# Patient Record
Sex: Female | Born: 1945 | Race: White | Hispanic: No | Smoking: Never smoker
Health system: Southern US, Community
[De-identification: ages and names within clinical notes are randomized; demographics above are authoritative.]

## PROBLEM LIST (undated history)

## (undated) DIAGNOSIS — M1990 Unspecified osteoarthritis, unspecified site: Secondary | ICD-10-CM

## (undated) DIAGNOSIS — R011 Cardiac murmur, unspecified: Secondary | ICD-10-CM

## (undated) DIAGNOSIS — E119 Type 2 diabetes mellitus without complications: Secondary | ICD-10-CM

## (undated) DIAGNOSIS — E7800 Pure hypercholesterolemia, unspecified: Secondary | ICD-10-CM

## (undated) DIAGNOSIS — G629 Polyneuropathy, unspecified: Secondary | ICD-10-CM

## (undated) DIAGNOSIS — I10 Essential (primary) hypertension: Secondary | ICD-10-CM

## (undated) DIAGNOSIS — E039 Hypothyroidism, unspecified: Secondary | ICD-10-CM

## (undated) HISTORY — PX: ABDOMINAL HYSTERECTOMY: SHX81

## (undated) HISTORY — PX: BACK SURGERY: SHX140

## (undated) HISTORY — PX: CHOLECYSTECTOMY: SHX55

## (undated) HISTORY — PX: EYE SURGERY: SHX253

---

## 2006-01-08 ENCOUNTER — Ambulatory Visit: Payer: Self-pay | Admitting: Unknown Physician Specialty

## 2006-01-19 ENCOUNTER — Ambulatory Visit: Payer: Self-pay | Admitting: Unknown Physician Specialty

## 2006-02-11 ENCOUNTER — Ambulatory Visit: Payer: Self-pay | Admitting: Pain Medicine

## 2006-02-15 ENCOUNTER — Emergency Department: Payer: Self-pay | Admitting: Emergency Medicine

## 2006-02-24 ENCOUNTER — Ambulatory Visit: Payer: Self-pay | Admitting: Pain Medicine

## 2006-03-17 ENCOUNTER — Ambulatory Visit: Payer: Self-pay | Admitting: Physician Assistant

## 2006-04-01 ENCOUNTER — Ambulatory Visit: Payer: Self-pay | Admitting: Pain Medicine

## 2006-04-02 ENCOUNTER — Ambulatory Visit: Payer: Self-pay | Admitting: Pain Medicine

## 2006-04-23 ENCOUNTER — Ambulatory Visit: Payer: Self-pay | Admitting: Pain Medicine

## 2006-05-18 ENCOUNTER — Ambulatory Visit: Payer: Self-pay | Admitting: Physician Assistant

## 2011-08-28 ENCOUNTER — Emergency Department: Payer: Self-pay | Admitting: Emergency Medicine

## 2011-08-28 LAB — URINALYSIS, COMPLETE
Glucose,UR: NEGATIVE mg/dL (ref 0–75)
Nitrite: NEGATIVE
Protein: NEGATIVE
RBC,UR: 4 /HPF (ref 0–5)
Squamous Epithelial: 5
WBC UR: 6 /HPF (ref 0–5)

## 2012-08-09 DIAGNOSIS — G894 Chronic pain syndrome: Secondary | ICD-10-CM | POA: Insufficient documentation

## 2012-08-09 DIAGNOSIS — M47817 Spondylosis without myelopathy or radiculopathy, lumbosacral region: Secondary | ICD-10-CM | POA: Insufficient documentation

## 2012-09-08 ENCOUNTER — Ambulatory Visit: Payer: Self-pay | Admitting: Family Medicine

## 2012-11-19 ENCOUNTER — Ambulatory Visit: Payer: Self-pay | Admitting: Unknown Physician Specialty

## 2013-09-02 ENCOUNTER — Encounter: Payer: Self-pay | Admitting: Internal Medicine

## 2013-09-06 LAB — HEMOGLOBIN A1C: HEMOGLOBIN A1C: 5.5 % (ref 4.2–6.3)

## 2013-09-08 LAB — CBC WITH DIFFERENTIAL/PLATELET
BASOS PCT: 0.6 %
Basophil #: 0.1 10*3/uL (ref 0.0–0.1)
EOS ABS: 0.2 10*3/uL (ref 0.0–0.7)
Eosinophil %: 2.1 %
HCT: 27.2 % — AB (ref 35.0–47.0)
HGB: 8.9 g/dL — ABNORMAL LOW (ref 12.0–16.0)
LYMPHS PCT: 28.8 %
Lymphocyte #: 3.2 10*3/uL (ref 1.0–3.6)
MCH: 29 pg (ref 26.0–34.0)
MCHC: 32.6 g/dL (ref 32.0–36.0)
MCV: 89 fL (ref 80–100)
MONOS PCT: 8.7 %
Monocyte #: 1 x10 3/mm — ABNORMAL HIGH (ref 0.2–0.9)
NEUTROS ABS: 6.6 10*3/uL — AB (ref 1.4–6.5)
Neutrophil %: 59.8 %
PLATELETS: 446 10*3/uL — AB (ref 150–440)
RBC: 3.06 10*6/uL — AB (ref 3.80–5.20)
RDW: 14.2 % (ref 11.5–14.5)
WBC: 11.1 10*3/uL — AB (ref 3.6–11.0)

## 2013-09-19 ENCOUNTER — Encounter: Payer: Self-pay | Admitting: Internal Medicine

## 2013-09-20 LAB — URINALYSIS, COMPLETE
BLOOD: NEGATIVE
Bilirubin,UR: NEGATIVE
Glucose,UR: NEGATIVE mg/dL (ref 0–75)
Ketone: NEGATIVE
NITRITE: NEGATIVE
Ph: 7 (ref 4.5–8.0)
SPECIFIC GRAVITY: 1.018 (ref 1.003–1.030)
Squamous Epithelial: 2
WBC UR: 1000 /HPF (ref 0–5)

## 2013-09-22 LAB — URINE CULTURE

## 2013-09-28 LAB — URINALYSIS, COMPLETE
Bilirubin,UR: NEGATIVE
GLUCOSE, UR: NEGATIVE mg/dL (ref 0–75)
Ketone: NEGATIVE
Nitrite: POSITIVE
PH: 5 (ref 4.5–8.0)
RBC,UR: 69 /HPF (ref 0–5)
SPECIFIC GRAVITY: 1.017 (ref 1.003–1.030)
WBC UR: 5244 /HPF (ref 0–5)

## 2013-10-01 LAB — URINE CULTURE

## 2014-04-25 DIAGNOSIS — E039 Hypothyroidism, unspecified: Secondary | ICD-10-CM | POA: Diagnosis not present

## 2014-04-25 DIAGNOSIS — E785 Hyperlipidemia, unspecified: Secondary | ICD-10-CM | POA: Diagnosis not present

## 2014-04-25 DIAGNOSIS — E119 Type 2 diabetes mellitus without complications: Secondary | ICD-10-CM | POA: Diagnosis not present

## 2014-04-25 DIAGNOSIS — I10 Essential (primary) hypertension: Secondary | ICD-10-CM | POA: Diagnosis not present

## 2014-05-24 DIAGNOSIS — M545 Low back pain: Secondary | ICD-10-CM | POA: Diagnosis not present

## 2014-05-24 DIAGNOSIS — G894 Chronic pain syndrome: Secondary | ICD-10-CM | POA: Diagnosis not present

## 2014-05-24 DIAGNOSIS — M533 Sacrococcygeal disorders, not elsewhere classified: Secondary | ICD-10-CM | POA: Diagnosis not present

## 2014-05-24 DIAGNOSIS — Z79891 Long term (current) use of opiate analgesic: Secondary | ICD-10-CM | POA: Diagnosis not present

## 2014-05-24 DIAGNOSIS — M419 Scoliosis, unspecified: Secondary | ICD-10-CM | POA: Diagnosis not present

## 2014-05-24 DIAGNOSIS — K5909 Other constipation: Secondary | ICD-10-CM | POA: Diagnosis not present

## 2014-05-30 DIAGNOSIS — M533 Sacrococcygeal disorders, not elsewhere classified: Secondary | ICD-10-CM | POA: Diagnosis not present

## 2014-05-30 DIAGNOSIS — Z79899 Other long term (current) drug therapy: Secondary | ICD-10-CM | POA: Diagnosis not present

## 2014-08-11 DIAGNOSIS — G894 Chronic pain syndrome: Secondary | ICD-10-CM | POA: Diagnosis not present

## 2014-08-11 DIAGNOSIS — M545 Low back pain: Secondary | ICD-10-CM | POA: Diagnosis not present

## 2014-08-11 DIAGNOSIS — Z79899 Other long term (current) drug therapy: Secondary | ICD-10-CM | POA: Diagnosis not present

## 2014-08-11 DIAGNOSIS — M533 Sacrococcygeal disorders, not elsewhere classified: Secondary | ICD-10-CM | POA: Diagnosis not present

## 2014-08-16 DIAGNOSIS — E782 Mixed hyperlipidemia: Secondary | ICD-10-CM | POA: Diagnosis not present

## 2014-08-16 DIAGNOSIS — E119 Type 2 diabetes mellitus without complications: Secondary | ICD-10-CM | POA: Diagnosis not present

## 2014-08-16 DIAGNOSIS — I10 Essential (primary) hypertension: Secondary | ICD-10-CM | POA: Diagnosis not present

## 2014-08-23 DIAGNOSIS — Z0000 Encounter for general adult medical examination without abnormal findings: Secondary | ICD-10-CM | POA: Diagnosis not present

## 2014-08-23 DIAGNOSIS — E039 Hypothyroidism, unspecified: Secondary | ICD-10-CM | POA: Diagnosis not present

## 2014-08-23 DIAGNOSIS — I10 Essential (primary) hypertension: Secondary | ICD-10-CM | POA: Diagnosis not present

## 2014-08-23 DIAGNOSIS — E782 Mixed hyperlipidemia: Secondary | ICD-10-CM | POA: Diagnosis not present

## 2014-09-12 ENCOUNTER — Other Ambulatory Visit: Payer: Self-pay | Admitting: Family Medicine

## 2014-09-12 DIAGNOSIS — Z1231 Encounter for screening mammogram for malignant neoplasm of breast: Secondary | ICD-10-CM

## 2014-09-25 ENCOUNTER — Other Ambulatory Visit: Payer: Self-pay | Admitting: Family Medicine

## 2014-09-25 ENCOUNTER — Ambulatory Visit
Admission: RE | Admit: 2014-09-25 | Discharge: 2014-09-25 | Disposition: A | Discharge status: Home / Self Care | Payer: Commercial Managed Care - HMO | Source: Ambulatory Visit | Attending: Family Medicine | Admitting: Family Medicine

## 2014-09-25 DIAGNOSIS — Z1231 Encounter for screening mammogram for malignant neoplasm of breast: Secondary | ICD-10-CM

## 2014-11-03 DIAGNOSIS — G894 Chronic pain syndrome: Secondary | ICD-10-CM | POA: Diagnosis not present

## 2014-11-03 DIAGNOSIS — M533 Sacrococcygeal disorders, not elsewhere classified: Secondary | ICD-10-CM | POA: Diagnosis not present

## 2014-11-03 DIAGNOSIS — Z79899 Other long term (current) drug therapy: Secondary | ICD-10-CM | POA: Diagnosis not present

## 2014-11-03 DIAGNOSIS — M545 Low back pain: Secondary | ICD-10-CM | POA: Diagnosis not present

## 2014-11-03 DIAGNOSIS — M47817 Spondylosis without myelopathy or radiculopathy, lumbosacral region: Secondary | ICD-10-CM | POA: Diagnosis not present

## 2014-11-03 DIAGNOSIS — M4804 Spinal stenosis, thoracic region: Secondary | ICD-10-CM | POA: Diagnosis not present

## 2014-12-19 DIAGNOSIS — E119 Type 2 diabetes mellitus without complications: Secondary | ICD-10-CM | POA: Diagnosis not present

## 2014-12-19 DIAGNOSIS — I10 Essential (primary) hypertension: Secondary | ICD-10-CM | POA: Diagnosis not present

## 2014-12-19 DIAGNOSIS — E782 Mixed hyperlipidemia: Secondary | ICD-10-CM | POA: Diagnosis not present

## 2014-12-19 DIAGNOSIS — E039 Hypothyroidism, unspecified: Secondary | ICD-10-CM | POA: Diagnosis not present

## 2014-12-26 DIAGNOSIS — E119 Type 2 diabetes mellitus without complications: Secondary | ICD-10-CM | POA: Diagnosis not present

## 2014-12-26 DIAGNOSIS — I10 Essential (primary) hypertension: Secondary | ICD-10-CM | POA: Diagnosis not present

## 2014-12-26 DIAGNOSIS — E039 Hypothyroidism, unspecified: Secondary | ICD-10-CM | POA: Diagnosis not present

## 2014-12-26 DIAGNOSIS — E782 Mixed hyperlipidemia: Secondary | ICD-10-CM | POA: Diagnosis not present

## 2015-02-19 DIAGNOSIS — M961 Postlaminectomy syndrome, not elsewhere classified: Secondary | ICD-10-CM | POA: Diagnosis not present

## 2015-02-19 DIAGNOSIS — G894 Chronic pain syndrome: Secondary | ICD-10-CM | POA: Diagnosis not present

## 2015-02-19 DIAGNOSIS — Z79891 Long term (current) use of opiate analgesic: Secondary | ICD-10-CM | POA: Diagnosis not present

## 2015-02-19 DIAGNOSIS — M47817 Spondylosis without myelopathy or radiculopathy, lumbosacral region: Secondary | ICD-10-CM | POA: Diagnosis not present

## 2015-02-19 DIAGNOSIS — M533 Sacrococcygeal disorders, not elsewhere classified: Secondary | ICD-10-CM | POA: Diagnosis not present

## 2015-02-20 DIAGNOSIS — E039 Hypothyroidism, unspecified: Secondary | ICD-10-CM | POA: Diagnosis not present

## 2015-03-05 DIAGNOSIS — R350 Frequency of micturition: Secondary | ICD-10-CM | POA: Diagnosis not present

## 2015-04-18 DIAGNOSIS — M961 Postlaminectomy syndrome, not elsewhere classified: Secondary | ICD-10-CM | POA: Diagnosis not present

## 2015-04-18 DIAGNOSIS — M4804 Spinal stenosis, thoracic region: Secondary | ICD-10-CM | POA: Diagnosis not present

## 2015-04-18 DIAGNOSIS — G894 Chronic pain syndrome: Secondary | ICD-10-CM | POA: Diagnosis not present

## 2015-04-18 DIAGNOSIS — Z79899 Other long term (current) drug therapy: Secondary | ICD-10-CM | POA: Diagnosis not present

## 2015-04-18 DIAGNOSIS — Z981 Arthrodesis status: Secondary | ICD-10-CM | POA: Diagnosis not present

## 2015-04-18 DIAGNOSIS — M545 Low back pain: Secondary | ICD-10-CM | POA: Diagnosis not present

## 2015-04-18 DIAGNOSIS — M533 Sacrococcygeal disorders, not elsewhere classified: Secondary | ICD-10-CM | POA: Diagnosis not present

## 2015-04-18 DIAGNOSIS — G8929 Other chronic pain: Secondary | ICD-10-CM | POA: Diagnosis not present

## 2015-04-18 DIAGNOSIS — Z79891 Long term (current) use of opiate analgesic: Secondary | ICD-10-CM | POA: Diagnosis not present

## 2015-04-18 DIAGNOSIS — Z7982 Long term (current) use of aspirin: Secondary | ICD-10-CM | POA: Diagnosis not present

## 2015-04-18 DIAGNOSIS — M47817 Spondylosis without myelopathy or radiculopathy, lumbosacral region: Secondary | ICD-10-CM | POA: Diagnosis not present

## 2015-04-20 DIAGNOSIS — E119 Type 2 diabetes mellitus without complications: Secondary | ICD-10-CM | POA: Diagnosis not present

## 2015-04-20 DIAGNOSIS — E782 Mixed hyperlipidemia: Secondary | ICD-10-CM | POA: Diagnosis not present

## 2015-04-20 DIAGNOSIS — I10 Essential (primary) hypertension: Secondary | ICD-10-CM | POA: Diagnosis not present

## 2015-04-27 DIAGNOSIS — I10 Essential (primary) hypertension: Secondary | ICD-10-CM | POA: Diagnosis not present

## 2015-04-27 DIAGNOSIS — E119 Type 2 diabetes mellitus without complications: Secondary | ICD-10-CM | POA: Diagnosis not present

## 2015-04-27 DIAGNOSIS — N289 Disorder of kidney and ureter, unspecified: Secondary | ICD-10-CM | POA: Diagnosis not present

## 2015-04-27 DIAGNOSIS — E782 Mixed hyperlipidemia: Secondary | ICD-10-CM | POA: Diagnosis not present

## 2015-04-27 DIAGNOSIS — E039 Hypothyroidism, unspecified: Secondary | ICD-10-CM | POA: Diagnosis not present

## 2015-05-25 DIAGNOSIS — H2511 Age-related nuclear cataract, right eye: Secondary | ICD-10-CM | POA: Diagnosis not present

## 2015-06-22 DIAGNOSIS — H2511 Age-related nuclear cataract, right eye: Secondary | ICD-10-CM | POA: Diagnosis not present

## 2015-06-28 ENCOUNTER — Encounter: Payer: Self-pay | Admitting: *Deleted

## 2015-07-03 ENCOUNTER — Ambulatory Visit
Admission: RE | Admit: 2015-07-03 | Discharge: 2015-07-03 | Disposition: A | Discharge status: Home / Self Care | Payer: Commercial Managed Care - HMO | Source: Ambulatory Visit | Attending: Ophthalmology | Admitting: Ophthalmology

## 2015-07-03 ENCOUNTER — Ambulatory Visit: Payer: Commercial Managed Care - HMO | Admitting: Anesthesiology

## 2015-07-03 ENCOUNTER — Encounter: Payer: Self-pay | Admitting: *Deleted

## 2015-07-03 ENCOUNTER — Encounter
Admission: RE | Disposition: A | Discharge status: Home / Self Care | Payer: Self-pay | Source: Ambulatory Visit | Attending: Ophthalmology

## 2015-07-03 DIAGNOSIS — H2511 Age-related nuclear cataract, right eye: Secondary | ICD-10-CM | POA: Diagnosis not present

## 2015-07-03 DIAGNOSIS — E119 Type 2 diabetes mellitus without complications: Secondary | ICD-10-CM | POA: Diagnosis not present

## 2015-07-03 DIAGNOSIS — Z9049 Acquired absence of other specified parts of digestive tract: Secondary | ICD-10-CM | POA: Insufficient documentation

## 2015-07-03 DIAGNOSIS — Z880 Allergy status to penicillin: Secondary | ICD-10-CM | POA: Diagnosis not present

## 2015-07-03 DIAGNOSIS — E039 Hypothyroidism, unspecified: Secondary | ICD-10-CM | POA: Diagnosis not present

## 2015-07-03 DIAGNOSIS — E7800 Pure hypercholesterolemia, unspecified: Secondary | ICD-10-CM | POA: Diagnosis not present

## 2015-07-03 DIAGNOSIS — Z90710 Acquired absence of both cervix and uterus: Secondary | ICD-10-CM | POA: Diagnosis not present

## 2015-07-03 DIAGNOSIS — G629 Polyneuropathy, unspecified: Secondary | ICD-10-CM | POA: Insufficient documentation

## 2015-07-03 DIAGNOSIS — I10 Essential (primary) hypertension: Secondary | ICD-10-CM | POA: Diagnosis not present

## 2015-07-03 DIAGNOSIS — E079 Disorder of thyroid, unspecified: Secondary | ICD-10-CM | POA: Insufficient documentation

## 2015-07-03 DIAGNOSIS — G4730 Sleep apnea, unspecified: Secondary | ICD-10-CM | POA: Diagnosis not present

## 2015-07-03 HISTORY — DX: Hypothyroidism, unspecified: E03.9

## 2015-07-03 HISTORY — DX: Polyneuropathy, unspecified: G62.9

## 2015-07-03 HISTORY — DX: Type 2 diabetes mellitus without complications: E11.9

## 2015-07-03 HISTORY — PX: CATARACT EXTRACTION W/PHACO: SHX586

## 2015-07-03 HISTORY — DX: Essential (primary) hypertension: I10

## 2015-07-03 LAB — GLUCOSE, CAPILLARY: Glucose-Capillary: 111 mg/dL — ABNORMAL HIGH (ref 65–99)

## 2015-07-03 SURGERY — PHACOEMULSIFICATION, CATARACT, WITH IOL INSERTION
Anesthesia: Monitor Anesthesia Care | Site: Eye | Laterality: Right | Wound class: Clean

## 2015-07-03 MED ORDER — NA CHONDROIT SULF-NA HYALURON 40-17 MG/ML IO SOLN
INTRAOCULAR | Status: AC
  Filled 2015-07-03: qty 1

## 2015-07-03 MED ORDER — CARBACHOL 0.01 % IO SOLN
INTRAOCULAR | Status: DC | PRN
  Administered 2015-07-03: 0.5 mL via INTRAOCULAR

## 2015-07-03 MED ORDER — TETRACAINE HCL 0.5 % OP SOLN
1.0000 [drp] | OPHTHALMIC | Status: AC | PRN
  Administered 2015-07-03: 1 [drp] via OPHTHALMIC

## 2015-07-03 MED ORDER — MOXIFLOXACIN HCL 0.5 % OP SOLN
OPHTHALMIC | Status: AC
  Filled 2015-07-03: qty 3

## 2015-07-03 MED ORDER — ARMC OPHTHALMIC DILATING GEL
1.0000 | OPHTHALMIC | Status: AC | PRN
Start: 2015-07-03 — End: 2015-07-03
  Administered 2015-07-03 (×2): 1 via OPHTHALMIC

## 2015-07-03 MED ORDER — POVIDONE-IODINE 5 % OP SOLN
1.0000 "application " | OPHTHALMIC | Status: AC | PRN
  Administered 2015-07-03: 1 via OPHTHALMIC

## 2015-07-03 MED ORDER — EPINEPHRINE HCL 1 MG/ML IJ SOLN
INTRAMUSCULAR | Status: AC
  Filled 2015-07-03: qty 1

## 2015-07-03 MED ORDER — TETRACAINE HCL 0.5 % OP SOLN
OPHTHALMIC | Status: AC
Start: 2015-07-03 — End: 2015-07-03
  Administered 2015-07-03: 1 [drp] via OPHTHALMIC
  Filled 2015-07-03: qty 2

## 2015-07-03 MED ORDER — CEFUROXIME OPHTHALMIC INJECTION 1 MG/0.1 ML
INJECTION | OPHTHALMIC | Status: AC
  Filled 2015-07-03: qty 0.1

## 2015-07-03 MED ORDER — MOXIFLOXACIN HCL 0.5 % OP SOLN
OPHTHALMIC | Status: DC | PRN
  Administered 2015-07-03: 1 [drp] via OPHTHALMIC

## 2015-07-03 MED ORDER — CEFUROXIME OPHTHALMIC INJECTION 1 MG/0.1 ML
INJECTION | OPHTHALMIC | Status: DC | PRN
  Administered 2015-07-03: 0.1 mL via INTRACAMERAL

## 2015-07-03 MED ORDER — POVIDONE-IODINE 5 % OP SOLN
OPHTHALMIC | Status: AC
Start: 2015-07-03 — End: 2015-07-03
  Administered 2015-07-03: 1 via OPHTHALMIC
  Filled 2015-07-03: qty 30

## 2015-07-03 MED ORDER — FENTANYL CITRATE (PF) 100 MCG/2ML IJ SOLN
INTRAMUSCULAR | Status: DC | PRN
  Administered 2015-07-03: 50 ug via INTRAVENOUS

## 2015-07-03 MED ORDER — ARMC OPHTHALMIC DILATING GEL
OPHTHALMIC | Status: AC
  Administered 2015-07-03: 1 via OPHTHALMIC
  Filled 2015-07-03: qty 0.25

## 2015-07-03 MED ORDER — SODIUM CHLORIDE 0.9 % IV SOLN
INTRAVENOUS | Status: DC
  Administered 2015-07-03: 09:00:00 via INTRAVENOUS

## 2015-07-03 MED ORDER — MOXIFLOXACIN HCL 0.5 % OP SOLN: 1.0000 [drp] | OPHTHALMIC | Status: DC | PRN

## 2015-07-03 MED ORDER — EPINEPHRINE HCL 1 MG/ML IJ SOLN
INTRAOCULAR | Status: DC | PRN
  Administered 2015-07-03: 1 mL via OPHTHALMIC

## 2015-07-03 MED ORDER — MIDAZOLAM HCL 2 MG/2ML IJ SOLN
INTRAMUSCULAR | Status: DC | PRN
  Administered 2015-07-03: 1 mg via INTRAVENOUS

## 2015-07-03 MED ORDER — NA CHONDROIT SULF-NA HYALURON 40-17 MG/ML IO SOLN
INTRAOCULAR | Status: DC | PRN
  Administered 2015-07-03: 1 mL via INTRAOCULAR

## 2015-07-03 SURGICAL SUPPLY — 22 items
CANNULA ANT/CHMB 27GA (MISCELLANEOUS) ×3 IMPLANT
CUP MEDICINE 2OZ PLAST GRAD ST (MISCELLANEOUS) ×3 IMPLANT
GLOVE BIO SURGEON STRL SZ8 (GLOVE) ×3 IMPLANT
GLOVE BIOGEL M 6.5 STRL (GLOVE) ×3 IMPLANT
GLOVE SURG LX 8.0 MICRO (GLOVE) ×2
GLOVE SURG LX STRL 8.0 MICRO (GLOVE) ×1 IMPLANT
GOWN STRL REUS W/ TWL LRG LVL3 (GOWN DISPOSABLE) ×2 IMPLANT
GOWN STRL REUS W/TWL LRG LVL3 (GOWN DISPOSABLE) ×4
LENS IOL TECNIS 20.5 (Intraocular Lens) ×3 IMPLANT
LENS IOL TECNIS MONO 1P 20.5 (Intraocular Lens) ×1 IMPLANT
PACK CATARACT (MISCELLANEOUS) ×3 IMPLANT
PACK CATARACT BRASINGTON LX (MISCELLANEOUS) ×3 IMPLANT
PACK EYE AFTER SURG (MISCELLANEOUS) ×3 IMPLANT
SOL BSS BAG (MISCELLANEOUS) ×3
SOL PREP PVP 2OZ (MISCELLANEOUS) ×3
SOLUTION BSS BAG (MISCELLANEOUS) ×1 IMPLANT
SOLUTION PREP PVP 2OZ (MISCELLANEOUS) ×1 IMPLANT
SYR 3ML LL SCALE MARK (SYRINGE) ×3 IMPLANT
SYR 5ML LL (SYRINGE) ×3 IMPLANT
SYR TB 1ML 27GX1/2 LL (SYRINGE) ×3 IMPLANT
WATER STERILE IRR 1000ML POUR (IV SOLUTION) ×3 IMPLANT
WIPE NON LINTING 3.25X3.25 (MISCELLANEOUS) ×3 IMPLANT

## 2015-07-03 NOTE — Discharge Instructions (Signed)
AMBULATORY SURGERY  DISCHARGE INSTRUCTIONS   1) The drugs that you were given will stay in your system until tomorrow so for the next 24 hours you should not:  A) Drive an automobile B) Make any legal decisions C) Drink any alcoholic beverage   2) You may resume regular meals tomorrow.  Today it is better to start with liquids and gradually work up to solid foods.  You may eat anything you prefer, but it is better to start with liquids, then soup and crackers, and gradually work up to solid foods.   3) Please notify your doctor immediately if you have any unusual bleeding, trouble breathing, redness and pain at the surgery site, drainage, fever, or pain not relieved by medication.    4) Additional Instructions:   Eye Surgery Discharge Instructions  Expect mild scratchy sensation or mild soreness. DO NOT RUB YOUR EYE!  The day of surgery:  Minimal physical activity, but bed rest is not required  No reading, computer work, or close hand work  No bending, lifting, or straining.  May watch TV  For 24 hours:  No driving, legal decisions, or alcoholic beverages  Safety precautions  Eat anything you prefer: It is better to start with liquids, then soup then solid foods.  _____ Eye patch should be worn until postoperative exam tomorrow.  ____ Solar shield eyeglasses should be worn for comfort in the sunlight/patch while sleeping  Resume all regular medications including aspirin or Coumadin if these were discontinued prior to surgery. You may shower, bathe, shave, or wash your hair. Tylenol may be taken for mild discomfort.  Call your doctor if you experience significant pain, nausea, or vomiting, fever > 101 or other signs of infection. [phone] or [phone] Specific instructions:  Follow-up Information    Follow up with PORFILIO,WILLIAM LOUIS, MD In 1 day.   Specialty:  Ophthalmology   Why:  March 15 at 10:00am   Contact information:   [place] [address] [phone]          Please contact your physician with any problems or Same Day Surgery at [phone], Monday through Friday 6 am to 4 pm, or [place] at [facility] number at [phone].

## 2015-07-03 NOTE — H&P (Signed)
All labs reviewed. Abnormal studies sent to patients PCP when indicated.  Previous H&P reviewed, patient examined, there are NO CHANGES.  Cynthia Dawson LOUIS3/14/20178:42 AM

## 2015-07-03 NOTE — Anesthesia Procedure Notes (Signed)
Procedure Name: MAC Date/Time: 07/03/2015 8:30 AM Performed by: Allean Found Pre-anesthesia Checklist: Patient identified, Emergency Drugs available, Suction available, Patient being monitored and Timeout performed Patient Re-evaluated:Patient Re-evaluated prior to inductionOxygen Delivery Method: Nasal cannula Intubation Type: IV induction Placement Confirmation: positive ETCO2

## 2015-07-03 NOTE — Op Note (Signed)
PREOPERATIVE DIAGNOSIS:  Nuclear sclerotic cataract of the right eye.   POSTOPERATIVE DIAGNOSIS:  nuclear sclerotic cataract right eye   OPERATIVE PROCEDURE: Procedure(s): CATARACT EXTRACTION PHACO AND INTRAOCULAR LENS PLACEMENT (IOC)   SURGEON:  Birder Robson, MD.   ANESTHESIA:  Anesthesiologist: Gijsbertus Lonia Mad, MD CRNA: Allean Found, CRNA  1.      Managed anesthesia care. 2.      Topical tetracaine drops followed by 2% Xylocaine jelly applied in the preoperative holding area.   COMPLICATIONS:  None.   TECHNIQUE:   Stop and chop   DESCRIPTION OF PROCEDURE:  The patient was examined and consented in the preoperative holding area where the aforementioned topical anesthesia was applied to the right eye and then brought back to the Operating Room where the right eye was prepped and draped in the usual sterile ophthalmic fashion and a lid speculum was placed. A paracentesis was created with the side port blade and the anterior chamber was filled with viscoelastic. A near clear corneal incision was performed with the steel keratome. A continuous curvilinear capsulorrhexis was performed with a cystotome followed by the capsulorrhexis forceps. Hydrodissection and hydrodelineation were carried out with BSS on a blunt cannula. The lens was removed in a stop and chop  technique and the remaining cortical material was removed with the irrigation-aspiration handpiece. The capsular bag was inflated with viscoelastic and the Technis [id number]  lens was placed in the capsular bag without complication. The remaining viscoelastic was removed from the eye with the irrigation-aspiration handpiece. The wounds were hydrated. The anterior chamber was flushed with Miostat and the eye was inflated to physiologic pressure. 0.2 mL of Vigamox diluted three/one with BSS was placed in the anterior chamber. The wounds were found to be water tight. The eye was dressed with Vigamox. The patient was given protective  glasses to wear throughout the day and a shield with which to sleep tonight. The patient was also given drops with which to begin a drop regimen today and will follow-up with me in one day.  Implant Name Type Inv. Item Serial No. Manufacturer Lot No. LRB No. Used  LENS IOL TECNIS 20.5 - [id number] Intraocular Lens LENS IOL TECNIS 20.5 [id number]   Right 1   Procedure(s) with comments: CATARACT EXTRACTION PHACO AND INTRAOCULAR LENS PLACEMENT (IOC) (Right) - [place] 01:12 AP% 22.4 CDE 16.28 fluid pack lot # [id number]  Electronically signed: [place] 07/03/2015 9:09 AM

## 2015-07-03 NOTE — Transfer of Care (Signed)
Immediate Anesthesia Transfer of Care Note  Patient: Camaria Nicklow Scearce  Procedure(s) Performed: Procedure(s) with comments: CATARACT EXTRACTION PHACO AND INTRAOCULAR LENS PLACEMENT (IOC) (Right) - [place] 01:12 AP% 22.4 CDE 16.28 fluid pack lot # [id number]  Patient Location: PACU  Anesthesia Type:MAC  Level of Consciousness: awake  Airway & Oxygen Therapy: Patient Spontanous Breathing  Post-op Assessment: Report given to RN and Post -op Vital signs reviewed and stable  Post vital signs: Reviewed and stable  Last Vitals:  Filed Vitals:   07/03/15 0712  BP: 138/63  Pulse: 72  Temp: 37 C  Resp: 16    Complications: No apparent anesthesia complications

## 2015-07-03 NOTE — Anesthesia Preprocedure Evaluation (Signed)
Anesthesia Evaluation  Patient identified by MRN, date of birth, ID band Patient awake    Reviewed: Allergy & Precautions, NPO status , Patient's Chart, lab work & pertinent test results  Airway Mallampati: III       Dental  (+) Teeth Intact   Pulmonary sleep apnea ,     + decreased breath sounds      Cardiovascular Exercise Tolerance: Good hypertension, Pt. on medications and Pt. on home beta blockers  Rhythm:Regular     Neuro/Psych    GI/Hepatic   Endo/Other  diabetes, Well Controlled, Type 2, Oral Hypoglycemic AgentsHypothyroidism Morbid obesity  Renal/GU negative Renal ROS     Musculoskeletal   Abdominal (+) + obese,   Peds  Hematology   Anesthesia Other Findings   Reproductive/Obstetrics                             Anesthesia Physical Anesthesia Plan  ASA: III  Anesthesia Plan: MAC   Post-op Pain Management:    Induction: Intravenous  Airway Management Planned: Natural Airway and Nasal Cannula  Additional Equipment:   Intra-op Plan:   Post-operative Plan:   Informed Consent: I have reviewed the patients History and Physical, chart, labs and discussed the procedure including the risks, benefits and alternatives for the proposed anesthesia with the patient or authorized representative who has indicated his/her understanding and acceptance.     Plan Discussed with: CRNA  Anesthesia Plan Comments:         Anesthesia Quick Evaluation

## 2015-07-03 NOTE — Anesthesia Postprocedure Evaluation (Signed)
Anesthesia Post Note  Patient: Wei Lohmeier Onken  Procedure(s) Performed: Procedure(s) (LRB): CATARACT EXTRACTION PHACO AND INTRAOCULAR LENS PLACEMENT (IOC) (Right)  Patient location during evaluation: PACU Anesthesia Type: MAC Level of consciousness: awake Pain management: pain level controlled Vital Signs Assessment: post-procedure vital signs reviewed and stable Respiratory status: spontaneous breathing Cardiovascular status: blood pressure returned to baseline Postop Assessment: no headache Anesthetic complications: no    Last Vitals:  Filed Vitals:   07/03/15 0712  BP: 138/63  Pulse: 72  Temp: 37 C  Resp: 16    Last Pain: There were no vitals filed for this visit.               Buckner [place]

## 2015-07-13 DIAGNOSIS — M47817 Spondylosis without myelopathy or radiculopathy, lumbosacral region: Secondary | ICD-10-CM | POA: Diagnosis not present

## 2015-07-13 DIAGNOSIS — G894 Chronic pain syndrome: Secondary | ICD-10-CM | POA: Diagnosis not present

## 2015-07-13 DIAGNOSIS — M961 Postlaminectomy syndrome, not elsewhere classified: Secondary | ICD-10-CM | POA: Diagnosis not present

## 2015-07-13 DIAGNOSIS — M545 Low back pain: Secondary | ICD-10-CM | POA: Diagnosis not present

## 2015-07-13 DIAGNOSIS — M533 Sacrococcygeal disorders, not elsewhere classified: Secondary | ICD-10-CM | POA: Diagnosis not present

## 2015-07-13 DIAGNOSIS — M4804 Spinal stenosis, thoracic region: Secondary | ICD-10-CM | POA: Diagnosis not present

## 2015-07-13 DIAGNOSIS — Z79899 Other long term (current) drug therapy: Secondary | ICD-10-CM | POA: Diagnosis not present

## 2015-07-13 DIAGNOSIS — Z79891 Long term (current) use of opiate analgesic: Secondary | ICD-10-CM | POA: Diagnosis not present

## 2015-07-13 DIAGNOSIS — G8929 Other chronic pain: Secondary | ICD-10-CM | POA: Diagnosis not present

## 2015-07-23 DIAGNOSIS — M546 Pain in thoracic spine: Secondary | ICD-10-CM | POA: Diagnosis not present

## 2015-07-23 DIAGNOSIS — M549 Dorsalgia, unspecified: Secondary | ICD-10-CM | POA: Diagnosis not present

## 2015-08-23 DIAGNOSIS — I10 Essential (primary) hypertension: Secondary | ICD-10-CM | POA: Diagnosis not present

## 2015-08-23 DIAGNOSIS — E119 Type 2 diabetes mellitus without complications: Secondary | ICD-10-CM | POA: Diagnosis not present

## 2015-08-23 DIAGNOSIS — E039 Hypothyroidism, unspecified: Secondary | ICD-10-CM | POA: Diagnosis not present

## 2015-08-23 DIAGNOSIS — E782 Mixed hyperlipidemia: Secondary | ICD-10-CM | POA: Diagnosis not present

## 2015-08-27 DIAGNOSIS — Z8619 Personal history of other infectious and parasitic diseases: Secondary | ICD-10-CM | POA: Diagnosis not present

## 2015-08-27 DIAGNOSIS — E119 Type 2 diabetes mellitus without complications: Secondary | ICD-10-CM | POA: Diagnosis not present

## 2015-08-27 DIAGNOSIS — E782 Mixed hyperlipidemia: Secondary | ICD-10-CM | POA: Diagnosis not present

## 2015-08-27 DIAGNOSIS — N289 Disorder of kidney and ureter, unspecified: Secondary | ICD-10-CM | POA: Diagnosis not present

## 2015-08-27 DIAGNOSIS — E039 Hypothyroidism, unspecified: Secondary | ICD-10-CM | POA: Diagnosis not present

## 2015-08-27 DIAGNOSIS — I10 Essential (primary) hypertension: Secondary | ICD-10-CM | POA: Diagnosis not present

## 2015-08-27 DIAGNOSIS — L723 Sebaceous cyst: Secondary | ICD-10-CM | POA: Diagnosis not present

## 2015-10-09 DIAGNOSIS — L02412 Cutaneous abscess of left axilla: Secondary | ICD-10-CM | POA: Diagnosis not present

## 2015-10-15 DIAGNOSIS — Z961 Presence of intraocular lens: Secondary | ICD-10-CM | POA: Diagnosis not present

## 2015-11-02 DIAGNOSIS — M961 Postlaminectomy syndrome, not elsewhere classified: Secondary | ICD-10-CM | POA: Diagnosis not present

## 2015-11-02 DIAGNOSIS — M47817 Spondylosis without myelopathy or radiculopathy, lumbosacral region: Secondary | ICD-10-CM | POA: Diagnosis not present

## 2015-11-02 DIAGNOSIS — Z79891 Long term (current) use of opiate analgesic: Secondary | ICD-10-CM | POA: Diagnosis not present

## 2015-11-02 DIAGNOSIS — M4804 Spinal stenosis, thoracic region: Secondary | ICD-10-CM | POA: Diagnosis not present

## 2015-11-02 DIAGNOSIS — G894 Chronic pain syndrome: Secondary | ICD-10-CM | POA: Diagnosis not present

## 2015-11-02 DIAGNOSIS — G8929 Other chronic pain: Secondary | ICD-10-CM | POA: Diagnosis not present

## 2015-11-02 DIAGNOSIS — M545 Low back pain: Secondary | ICD-10-CM | POA: Diagnosis not present

## 2015-11-02 DIAGNOSIS — M533 Sacrococcygeal disorders, not elsewhere classified: Secondary | ICD-10-CM | POA: Diagnosis not present

## 2015-12-21 DIAGNOSIS — N289 Disorder of kidney and ureter, unspecified: Secondary | ICD-10-CM | POA: Diagnosis not present

## 2015-12-21 DIAGNOSIS — E782 Mixed hyperlipidemia: Secondary | ICD-10-CM | POA: Diagnosis not present

## 2015-12-21 DIAGNOSIS — E039 Hypothyroidism, unspecified: Secondary | ICD-10-CM | POA: Diagnosis not present

## 2015-12-21 DIAGNOSIS — E119 Type 2 diabetes mellitus without complications: Secondary | ICD-10-CM | POA: Diagnosis not present

## 2015-12-21 DIAGNOSIS — I10 Essential (primary) hypertension: Secondary | ICD-10-CM | POA: Diagnosis not present

## 2015-12-28 DIAGNOSIS — E782 Mixed hyperlipidemia: Secondary | ICD-10-CM | POA: Diagnosis not present

## 2015-12-28 DIAGNOSIS — E119 Type 2 diabetes mellitus without complications: Secondary | ICD-10-CM | POA: Diagnosis not present

## 2015-12-28 DIAGNOSIS — I10 Essential (primary) hypertension: Secondary | ICD-10-CM | POA: Diagnosis not present

## 2015-12-28 DIAGNOSIS — Z0000 Encounter for general adult medical examination without abnormal findings: Secondary | ICD-10-CM | POA: Diagnosis not present

## 2015-12-28 DIAGNOSIS — R300 Dysuria: Secondary | ICD-10-CM | POA: Diagnosis not present

## 2015-12-28 DIAGNOSIS — E039 Hypothyroidism, unspecified: Secondary | ICD-10-CM | POA: Diagnosis not present

## 2016-01-03 ENCOUNTER — Other Ambulatory Visit: Payer: Self-pay | Admitting: Family Medicine

## 2016-01-03 DIAGNOSIS — Z1231 Encounter for screening mammogram for malignant neoplasm of breast: Secondary | ICD-10-CM

## 2016-01-17 DIAGNOSIS — M545 Low back pain: Secondary | ICD-10-CM | POA: Diagnosis not present

## 2016-01-17 DIAGNOSIS — M47817 Spondylosis without myelopathy or radiculopathy, lumbosacral region: Secondary | ICD-10-CM | POA: Diagnosis not present

## 2016-01-17 DIAGNOSIS — G894 Chronic pain syndrome: Secondary | ICD-10-CM | POA: Diagnosis not present

## 2016-01-17 DIAGNOSIS — M4804 Spinal stenosis, thoracic region: Secondary | ICD-10-CM | POA: Diagnosis not present

## 2016-01-17 DIAGNOSIS — G8929 Other chronic pain: Secondary | ICD-10-CM | POA: Diagnosis not present

## 2016-01-17 DIAGNOSIS — Z79891 Long term (current) use of opiate analgesic: Secondary | ICD-10-CM | POA: Diagnosis not present

## 2016-01-17 DIAGNOSIS — Z7982 Long term (current) use of aspirin: Secondary | ICD-10-CM | POA: Diagnosis not present

## 2016-01-17 DIAGNOSIS — Z7984 Long term (current) use of oral hypoglycemic drugs: Secondary | ICD-10-CM | POA: Diagnosis not present

## 2016-01-17 DIAGNOSIS — M961 Postlaminectomy syndrome, not elsewhere classified: Secondary | ICD-10-CM | POA: Diagnosis not present

## 2016-01-17 DIAGNOSIS — M533 Sacrococcygeal disorders, not elsewhere classified: Secondary | ICD-10-CM | POA: Diagnosis not present

## 2016-01-24 ENCOUNTER — Ambulatory Visit
Admission: RE | Admit: 2016-01-24 | Discharge: 2016-01-24 | Disposition: A | Discharge status: Home / Self Care | Payer: Commercial Managed Care - HMO | Source: Ambulatory Visit | Attending: Family Medicine | Admitting: Family Medicine

## 2016-01-24 DIAGNOSIS — Z1231 Encounter for screening mammogram for malignant neoplasm of breast: Secondary | ICD-10-CM | POA: Insufficient documentation

## 2016-03-05 DIAGNOSIS — Z86010 Personal history of colonic polyps: Secondary | ICD-10-CM | POA: Diagnosis not present

## 2016-03-07 DIAGNOSIS — H2512 Age-related nuclear cataract, left eye: Secondary | ICD-10-CM | POA: Diagnosis not present

## 2016-04-16 DIAGNOSIS — M961 Postlaminectomy syndrome, not elsewhere classified: Secondary | ICD-10-CM | POA: Diagnosis not present

## 2016-04-16 DIAGNOSIS — Z981 Arthrodesis status: Secondary | ICD-10-CM | POA: Diagnosis not present

## 2016-04-16 DIAGNOSIS — M533 Sacrococcygeal disorders, not elsewhere classified: Secondary | ICD-10-CM | POA: Diagnosis not present

## 2016-04-16 DIAGNOSIS — G894 Chronic pain syndrome: Secondary | ICD-10-CM | POA: Diagnosis not present

## 2016-04-16 DIAGNOSIS — G8929 Other chronic pain: Secondary | ICD-10-CM | POA: Diagnosis not present

## 2016-04-16 DIAGNOSIS — M4804 Spinal stenosis, thoracic region: Secondary | ICD-10-CM | POA: Diagnosis not present

## 2016-04-16 DIAGNOSIS — M545 Low back pain: Secondary | ICD-10-CM | POA: Diagnosis not present

## 2016-04-16 DIAGNOSIS — Z880 Allergy status to penicillin: Secondary | ICD-10-CM | POA: Diagnosis not present

## 2016-04-16 DIAGNOSIS — Z79891 Long term (current) use of opiate analgesic: Secondary | ICD-10-CM | POA: Diagnosis not present

## 2016-04-16 DIAGNOSIS — M47817 Spondylosis without myelopathy or radiculopathy, lumbosacral region: Secondary | ICD-10-CM | POA: Diagnosis not present

## 2016-04-22 DIAGNOSIS — E782 Mixed hyperlipidemia: Secondary | ICD-10-CM | POA: Diagnosis not present

## 2016-04-22 DIAGNOSIS — E119 Type 2 diabetes mellitus without complications: Secondary | ICD-10-CM | POA: Diagnosis not present

## 2016-04-22 DIAGNOSIS — I10 Essential (primary) hypertension: Secondary | ICD-10-CM | POA: Diagnosis not present

## 2016-05-01 DIAGNOSIS — I10 Essential (primary) hypertension: Secondary | ICD-10-CM | POA: Diagnosis not present

## 2016-05-01 DIAGNOSIS — E119 Type 2 diabetes mellitus without complications: Secondary | ICD-10-CM | POA: Diagnosis not present

## 2016-05-01 DIAGNOSIS — E039 Hypothyroidism, unspecified: Secondary | ICD-10-CM | POA: Diagnosis not present

## 2016-05-01 DIAGNOSIS — K529 Noninfective gastroenteritis and colitis, unspecified: Secondary | ICD-10-CM | POA: Diagnosis not present

## 2016-05-01 DIAGNOSIS — E782 Mixed hyperlipidemia: Secondary | ICD-10-CM | POA: Diagnosis not present

## 2016-06-02 DIAGNOSIS — R350 Frequency of micturition: Secondary | ICD-10-CM | POA: Diagnosis not present

## 2016-06-02 DIAGNOSIS — R300 Dysuria: Secondary | ICD-10-CM | POA: Diagnosis not present

## 2016-06-06 ENCOUNTER — Ambulatory Visit: Payer: Medicare HMO | Admitting: Anesthesiology

## 2016-06-06 ENCOUNTER — Encounter: Payer: Self-pay | Admitting: *Deleted

## 2016-06-06 ENCOUNTER — Ambulatory Visit
Admission: RE | Admit: 2016-06-06 | Discharge: 2016-06-06 | Disposition: A | Discharge status: Home / Self Care | Payer: Medicare HMO | Source: Ambulatory Visit | Attending: Unknown Physician Specialty | Admitting: Unknown Physician Specialty

## 2016-06-06 ENCOUNTER — Encounter
Admission: RE | Disposition: A | Discharge status: Home / Self Care | Payer: Self-pay | Source: Ambulatory Visit | Attending: Unknown Physician Specialty

## 2016-06-06 DIAGNOSIS — Z79899 Other long term (current) drug therapy: Secondary | ICD-10-CM | POA: Diagnosis not present

## 2016-06-06 DIAGNOSIS — K635 Polyp of colon: Secondary | ICD-10-CM | POA: Diagnosis not present

## 2016-06-06 DIAGNOSIS — D122 Benign neoplasm of ascending colon: Secondary | ICD-10-CM | POA: Diagnosis not present

## 2016-06-06 DIAGNOSIS — E039 Hypothyroidism, unspecified: Secondary | ICD-10-CM | POA: Diagnosis not present

## 2016-06-06 DIAGNOSIS — E119 Type 2 diabetes mellitus without complications: Secondary | ICD-10-CM | POA: Diagnosis not present

## 2016-06-06 DIAGNOSIS — Z7984 Long term (current) use of oral hypoglycemic drugs: Secondary | ICD-10-CM | POA: Insufficient documentation

## 2016-06-06 DIAGNOSIS — I10 Essential (primary) hypertension: Secondary | ICD-10-CM | POA: Diagnosis not present

## 2016-06-06 DIAGNOSIS — Z86010 Personal history of colonic polyps: Secondary | ICD-10-CM | POA: Diagnosis not present

## 2016-06-06 DIAGNOSIS — Z6841 Body Mass Index (BMI) 40.0 and over, adult: Secondary | ICD-10-CM | POA: Diagnosis not present

## 2016-06-06 DIAGNOSIS — K640 First degree hemorrhoids: Secondary | ICD-10-CM | POA: Insufficient documentation

## 2016-06-06 DIAGNOSIS — Z1211 Encounter for screening for malignant neoplasm of colon: Secondary | ICD-10-CM | POA: Insufficient documentation

## 2016-06-06 DIAGNOSIS — E669 Obesity, unspecified: Secondary | ICD-10-CM | POA: Insufficient documentation

## 2016-06-06 DIAGNOSIS — K648 Other hemorrhoids: Secondary | ICD-10-CM | POA: Diagnosis not present

## 2016-06-06 HISTORY — PX: COLONOSCOPY WITH PROPOFOL: SHX5780

## 2016-06-06 LAB — GLUCOSE, CAPILLARY: Glucose-Capillary: 98 mg/dL (ref 65–99)

## 2016-06-06 SURGERY — COLONOSCOPY WITH PROPOFOL
Anesthesia: General

## 2016-06-06 MED ORDER — SODIUM CHLORIDE 0.9 % IV SOLN: INTRAVENOUS | Status: DC

## 2016-06-06 MED ORDER — LIDOCAINE HCL (CARDIAC) 20 MG/ML IV SOLN
INTRAVENOUS | Status: DC | PRN
  Administered 2016-06-06: 1.5 mL via INTRAVENOUS

## 2016-06-06 MED ORDER — PROPOFOL 10 MG/ML IV BOLUS
INTRAVENOUS | Status: DC | PRN
  Administered 2016-06-06: 20 mg via INTRAVENOUS
  Administered 2016-06-06: 30 mg via INTRAVENOUS
  Administered 2016-06-06: 10 mg via INTRAVENOUS

## 2016-06-06 MED ORDER — SODIUM CHLORIDE 0.9 % IV SOLN
INTRAVENOUS | Status: DC
  Administered 2016-06-06: 1000 mL via INTRAVENOUS

## 2016-06-06 MED ORDER — EPHEDRINE SULFATE 50 MG/ML IJ SOLN
INTRAMUSCULAR | Status: DC | PRN
Start: 2016-06-06 — End: 2016-06-06
  Administered 2016-06-06 (×2): 10 mg via INTRAVENOUS

## 2016-06-06 MED ORDER — LIDOCAINE HCL (PF) 2 % IJ SOLN
INTRAMUSCULAR | Status: AC
  Filled 2016-06-06: qty 2

## 2016-06-06 MED ORDER — PROPOFOL 10 MG/ML IV BOLUS
INTRAVENOUS | Status: AC
  Filled 2016-06-06: qty 20

## 2016-06-06 MED ORDER — PROPOFOL 500 MG/50ML IV EMUL
INTRAVENOUS | Status: DC | PRN
  Administered 2016-06-06: 120 ug/kg/min via INTRAVENOUS

## 2016-06-06 MED ORDER — EPHEDRINE SULFATE 50 MG/ML IJ SOLN
INTRAMUSCULAR | Status: AC
  Filled 2016-06-06: qty 1

## 2016-06-06 NOTE — Anesthesia Preprocedure Evaluation (Incomplete)

## 2016-06-06 NOTE — Anesthesia Postprocedure Evaluation (Signed)
Anesthesia Post Note  Patient: Cynthia Dawson  Procedure(s) Performed: Procedure(s) (LRB): COLONOSCOPY WITH PROPOFOL (N/A)  Patient location during evaluation: PACU Anesthesia Type: General Level of consciousness: awake Pain management: pain level controlled Vital Signs Assessment: post-procedure vital signs reviewed and stable Respiratory status: spontaneous breathing Cardiovascular status: stable Anesthetic complications: no     Last Vitals:  Vitals:   06/06/16 0820 06/06/16 0830  BP: 116/72 118/72  Pulse: 73 73  Resp: (!) 22 (!) 22  Temp:      Last Pain:  Vitals:   06/06/16 0750  TempSrc: Tympanic                 VAN STAVEREN,Agostino Gorin

## 2016-06-06 NOTE — Anesthesia Preprocedure Evaluation (Addendum)
Anesthesia Evaluation  Patient identified by MRN, date of birth, ID band Patient awake    Reviewed: Allergy & Precautions, NPO status , Patient's Chart, lab work & pertinent test results  Airway Mallampati: III       Dental  (+) Teeth Intact   Pulmonary neg pulmonary ROS,     + decreased breath sounds      Cardiovascular Exercise Tolerance: Good hypertension, Pt. on home beta blockers  Rhythm:Regular Rate:Normal     Neuro/Psych negative neurological ROS  negative psych ROS   GI/Hepatic negative GI ROS, Neg liver ROS,   Endo/Other  diabetes, Type 2, Oral Hypoglycemic AgentsHypothyroidism   Renal/GU negative Renal ROS     Musculoskeletal   Abdominal (+) + obese,   Peds negative pediatric ROS (+)  Hematology negative hematology ROS (+)   Anesthesia Other Findings   Reproductive/Obstetrics                            Anesthesia Physical Anesthesia Plan  ASA: II  Anesthesia Plan: General   Post-op Pain Management:    Induction: Intravenous  Airway Management Planned: Natural Airway and Nasal Cannula  Additional Equipment:   Intra-op Plan:   Post-operative Plan:   Informed Consent: I have reviewed the patients History and Physical, chart, labs and discussed the procedure including the risks, benefits and alternatives for the proposed anesthesia with the patient or authorized representative who has indicated his/her understanding and acceptance.     Plan Discussed with: Surgeon  Anesthesia Plan Comments:         Anesthesia Quick Evaluation

## 2016-06-06 NOTE — Anesthesia Post-op Follow-up Note (Signed)
Anesthesia QCDR form completed.        

## 2016-06-06 NOTE — Transfer of Care (Signed)
Immediate Anesthesia Transfer of Care Note  Patient: Cynthia Dawson  Procedure(s) Performed: Procedure(s): COLONOSCOPY WITH PROPOFOL (N/A)  Patient Location: PACU  Anesthesia Type:General  Level of Consciousness: awake  Airway & Oxygen Therapy: Patient Spontanous Breathing  Post-op Assessment: Report given to RN  Post vital signs: Reviewed  Last Vitals:  Vitals:   06/06/16 0657  BP: (!) 155/69  Pulse: 84  Resp: 20  Temp: 36.1 C    Last Pain: There were no vitals filed for this visit.       Complications: No apparent anesthesia complications

## 2016-06-06 NOTE — Op Note (Signed)
[facility] Patient Name: Cynthia Dawson Procedure Date: 06/06/2016 7:25 AM MRN: [id number] Account #: [id number] Date of Birth: [birth year] Admit Type: Outpatient Age: 71 Room: [facility] ENDO ROOM 1 Gender: Female Note Status: Finalized Procedure:            Colonoscopy Indications:          High risk colon cancer surveillance: Personal history                        of colonic polyps Providers:            Manya Silvas, MD Referring MD:         Dion Body (Referring MD) Medicines:            Propofol per Anesthesia Complications:        No immediate complications. Procedure:            Pre-Anesthesia Assessment:                       - After reviewing the risks and benefits, the patient                        was deemed in satisfactory condition to undergo the                        procedure.                       After obtaining informed consent, the colonoscope was                        passed under direct vision. Throughout the procedure,                        the patient's blood pressure, pulse, and oxygen                        saturations were monitored continuously. The                        Colonoscope was introduced through the anus and                        advanced to the the cecum, identified by appendiceal                        orifice and ileocecal valve. The colonoscopy was                        performed without difficulty. The patient tolerated the                        procedure well. The quality of the bowel preparation                        was good. Findings:      A diminutive polyp was found in the proximal ascending colon. The polyp       was sessile. The polyp was removed with a jumbo cold forceps. Resection       and retrieval were complete.      Internal hemorrhoids were found during  endoscopy. The hemorrhoids were       small and Grade I (internal hemorrhoids that do not prolapse).      The exam was  otherwise without abnormality. Impression:           - One diminutive polyp in the proximal ascending colon,                        removed with a jumbo cold forceps. Resected and                        retrieved.                       - Internal hemorrhoids.                       - The examination was otherwise normal. Recommendation:       - Await pathology results. Manya Silvas, MD 06/06/2016 7:52:56 AM This report has been signed electronically. Number of Addenda: 0 Note Initiated On: 06/06/2016 7:25 AM Scope Withdrawal Time: 0 hours 7 minutes 43 seconds  Total Procedure Duration: 0 hours 17 minutes 54 seconds       [facility]

## 2016-06-06 NOTE — H&P (Signed)
Primary Care Physician:  Dion Body, MD Primary Gastroenterologist:  Dr. Vira Agar  Pre-Procedure History & Physical: HPI:  Cynthia Dawson is a 71 y.o. female is here for an colonoscopy.   Past Medical History:  Diagnosis Date  . Diabetes mellitus without complication ([place])   . Hypertension   . Hypothyroidism   . Neuropathy ([place])    MILD    Past Surgical History:  Procedure Laterality Date  . ABDOMINAL HYSTERECTOMY    . BACK SURGERY     RODS  . CATARACT EXTRACTION W/PHACO Right 07/03/2015   Procedure: CATARACT EXTRACTION PHACO AND INTRAOCULAR LENS PLACEMENT (IOC);  Surgeon: Birder Robson, MD;  Location: [facility] ORS;  Service: Ophthalmology;  Laterality: Right;  [place] 01:12 AP% 22.4 CDE 16.28 fluid pack lot # [id number]  . CESAREAN SECTION    . CHOLECYSTECTOMY    . EYE SURGERY      Prior to Admission medications   Medication Sig Start Date End Date Taking? Authorizing Provider  atorvastatin (LIPITOR) 20 MG tablet Take 20 mg by mouth daily.   Yes Historical Provider, MD  fenofibrate (TRICOR) 145 MG tablet Take 145 mg by mouth daily.   Yes Historical Provider, MD  gabapentin (NEURONTIN) 300 MG capsule Take 300 mg by mouth 3 (three) times daily. 2 CAPS 3 X A DAY   Yes Historical Provider, MD  HYDROcodone-acetaminophen (NORCO/VICODIN) 5-325 MG tablet Take 1 tablet by mouth.   Yes Historical Provider, MD  levothyroxine (SYNTHROID, LEVOTHROID) 75 MCG tablet Take 75 mcg by mouth daily before breakfast.   Yes Historical Provider, MD  lisinopril (PRINIVIL,ZESTRIL) 2.5 MG tablet Take 2.5 mg by mouth daily.   Yes Historical Provider, MD  metFORMIN (GLUCOPHAGE) 1000 MG tablet Take 500 mg by mouth daily with breakfast.   Yes Historical Provider, MD  METOPROLOL TARTRATE PO Take 25 mg by mouth daily.   Yes Historical Provider, MD  topiramate (TOPAMAX) 50 MG tablet Take 50 mg by mouth 2 (two) times daily.   Yes Historical Provider, MD  traMADol (ULTRAM) 50 MG tablet Take 50 mg by mouth  every 12 (twelve) hours as needed.   Yes Historical Provider, MD    Allergies as of 05/15/2016 - Review Complete 07/03/2015  Allergen Reaction Noted  . Penicillins Hives 06/28/2015    Family History  Problem Relation Age of Onset  . Breast cancer Paternal Aunt     Social History   Social History  . Marital status: Widowed    Spouse name: N/A  . Number of children: N/A  . Years of education: N/A   Occupational History  . Not on file.   Social History Main Topics  . Smoking status: Never Smoker  . Smokeless tobacco: Never Used  . Alcohol use No  . Drug use: No  . Sexual activity: Not on file   Other Topics Concern  . Not on file   Social History Narrative  . No narrative on file    Review of Systems: See HPI, otherwise negative ROS  Physical Exam: BP (!) 155/69   Pulse 84   Temp 97 F (36.1 C)   Resp 20   Ht 4\' 9"  (1.448 m)   Wt 86.2 kg (190 lb)   SpO2 99%   BMI 41.12 kg/m  General:   Alert,  pleasant and cooperative in NAD Head:  Normocephalic and atraumatic. Neck:  Supple; no masses or thyromegaly. Lungs:  Clear throughout to auscultation.    Heart:  Regular rate and rhythm. 1/6 systolic  murmur Abdomen:  Soft, nontender and nondistended. Normal bowel sounds, without guarding, and without rebound.   Neurologic:  Alert and  oriented x4;  grossly normal neurologically.  Impression/Plan: Rockie Neighbours is here for an colonoscopy to be performed for [facility] colon polyps  Risks, benefits, limitations, and alternatives regarding  colonoscopy have been reviewed with the patient.  Questions have been answered.  All parties agreeable.   Gaylyn Cheers, MD  06/06/2016, 7:26 AM

## 2016-06-09 ENCOUNTER — Encounter: Payer: Self-pay | Admitting: Unknown Physician Specialty

## 2016-06-09 LAB — SURGICAL PATHOLOGY

## 2016-06-13 NOTE — Addendum Note (Signed)
Addendum  created 06/13/16 1345 by Iver Nestle, MD   Anesthesia Review and Sign - Signed, Sign clinical note

## 2016-07-11 DIAGNOSIS — M533 Sacrococcygeal disorders, not elsewhere classified: Secondary | ICD-10-CM | POA: Diagnosis not present

## 2016-07-11 DIAGNOSIS — M47817 Spondylosis without myelopathy or radiculopathy, lumbosacral region: Secondary | ICD-10-CM | POA: Diagnosis not present

## 2016-07-11 DIAGNOSIS — G8929 Other chronic pain: Secondary | ICD-10-CM | POA: Diagnosis not present

## 2016-07-11 DIAGNOSIS — Z79891 Long term (current) use of opiate analgesic: Secondary | ICD-10-CM | POA: Diagnosis not present

## 2016-07-11 DIAGNOSIS — Z0289 Encounter for other administrative examinations: Secondary | ICD-10-CM | POA: Diagnosis not present

## 2016-07-11 DIAGNOSIS — G894 Chronic pain syndrome: Secondary | ICD-10-CM | POA: Diagnosis not present

## 2016-07-11 DIAGNOSIS — M961 Postlaminectomy syndrome, not elsewhere classified: Secondary | ICD-10-CM | POA: Diagnosis not present

## 2016-07-11 DIAGNOSIS — M4804 Spinal stenosis, thoracic region: Secondary | ICD-10-CM | POA: Diagnosis not present

## 2016-07-11 DIAGNOSIS — M545 Low back pain: Secondary | ICD-10-CM | POA: Diagnosis not present

## 2016-08-21 DIAGNOSIS — E039 Hypothyroidism, unspecified: Secondary | ICD-10-CM | POA: Diagnosis not present

## 2016-08-21 DIAGNOSIS — I10 Essential (primary) hypertension: Secondary | ICD-10-CM | POA: Diagnosis not present

## 2016-08-21 DIAGNOSIS — E782 Mixed hyperlipidemia: Secondary | ICD-10-CM | POA: Diagnosis not present

## 2016-08-21 DIAGNOSIS — E119 Type 2 diabetes mellitus without complications: Secondary | ICD-10-CM | POA: Diagnosis not present

## 2016-08-28 DIAGNOSIS — I10 Essential (primary) hypertension: Secondary | ICD-10-CM | POA: Diagnosis not present

## 2016-08-28 DIAGNOSIS — E782 Mixed hyperlipidemia: Secondary | ICD-10-CM | POA: Diagnosis not present

## 2016-08-28 DIAGNOSIS — N289 Disorder of kidney and ureter, unspecified: Secondary | ICD-10-CM | POA: Diagnosis not present

## 2016-08-28 DIAGNOSIS — E039 Hypothyroidism, unspecified: Secondary | ICD-10-CM | POA: Diagnosis not present

## 2016-08-28 DIAGNOSIS — E119 Type 2 diabetes mellitus without complications: Secondary | ICD-10-CM | POA: Diagnosis not present

## 2016-08-28 DIAGNOSIS — Z6841 Body Mass Index (BMI) 40.0 and over, adult: Secondary | ICD-10-CM | POA: Diagnosis not present

## 2016-08-28 DIAGNOSIS — N183 Chronic kidney disease, stage 3 unspecified: Secondary | ICD-10-CM | POA: Insufficient documentation

## 2016-10-20 DIAGNOSIS — G8929 Other chronic pain: Secondary | ICD-10-CM | POA: Diagnosis not present

## 2016-10-20 DIAGNOSIS — M533 Sacrococcygeal disorders, not elsewhere classified: Secondary | ICD-10-CM | POA: Diagnosis not present

## 2016-10-20 DIAGNOSIS — Z79891 Long term (current) use of opiate analgesic: Secondary | ICD-10-CM | POA: Diagnosis not present

## 2016-10-20 DIAGNOSIS — M4804 Spinal stenosis, thoracic region: Secondary | ICD-10-CM | POA: Diagnosis not present

## 2016-10-20 DIAGNOSIS — M545 Low back pain: Secondary | ICD-10-CM | POA: Diagnosis not present

## 2016-10-20 DIAGNOSIS — M47817 Spondylosis without myelopathy or radiculopathy, lumbosacral region: Secondary | ICD-10-CM | POA: Diagnosis not present

## 2016-10-20 DIAGNOSIS — G894 Chronic pain syndrome: Secondary | ICD-10-CM | POA: Diagnosis not present

## 2016-10-20 DIAGNOSIS — M961 Postlaminectomy syndrome, not elsewhere classified: Secondary | ICD-10-CM | POA: Diagnosis not present

## 2017-01-06 DIAGNOSIS — E782 Mixed hyperlipidemia: Secondary | ICD-10-CM | POA: Diagnosis not present

## 2017-01-06 DIAGNOSIS — E039 Hypothyroidism, unspecified: Secondary | ICD-10-CM | POA: Diagnosis not present

## 2017-01-06 DIAGNOSIS — E119 Type 2 diabetes mellitus without complications: Secondary | ICD-10-CM | POA: Diagnosis not present

## 2017-01-06 DIAGNOSIS — I10 Essential (primary) hypertension: Secondary | ICD-10-CM | POA: Diagnosis not present

## 2017-01-06 DIAGNOSIS — N289 Disorder of kidney and ureter, unspecified: Secondary | ICD-10-CM | POA: Diagnosis not present

## 2017-01-12 DIAGNOSIS — E119 Type 2 diabetes mellitus without complications: Secondary | ICD-10-CM | POA: Diagnosis not present

## 2017-01-12 DIAGNOSIS — N289 Disorder of kidney and ureter, unspecified: Secondary | ICD-10-CM | POA: Diagnosis not present

## 2017-01-12 DIAGNOSIS — E039 Hypothyroidism, unspecified: Secondary | ICD-10-CM | POA: Diagnosis not present

## 2017-01-12 DIAGNOSIS — I10 Essential (primary) hypertension: Secondary | ICD-10-CM | POA: Diagnosis not present

## 2017-01-12 DIAGNOSIS — Z0000 Encounter for general adult medical examination without abnormal findings: Secondary | ICD-10-CM | POA: Diagnosis not present

## 2017-01-12 DIAGNOSIS — Z6841 Body Mass Index (BMI) 40.0 and over, adult: Secondary | ICD-10-CM | POA: Diagnosis not present

## 2017-01-12 DIAGNOSIS — E782 Mixed hyperlipidemia: Secondary | ICD-10-CM | POA: Diagnosis not present

## 2017-02-02 DIAGNOSIS — G894 Chronic pain syndrome: Secondary | ICD-10-CM | POA: Diagnosis not present

## 2017-02-02 DIAGNOSIS — M4804 Spinal stenosis, thoracic region: Secondary | ICD-10-CM | POA: Diagnosis not present

## 2017-02-02 DIAGNOSIS — G8929 Other chronic pain: Secondary | ICD-10-CM | POA: Diagnosis not present

## 2017-02-02 DIAGNOSIS — M533 Sacrococcygeal disorders, not elsewhere classified: Secondary | ICD-10-CM | POA: Diagnosis not present

## 2017-02-02 DIAGNOSIS — M47817 Spondylosis without myelopathy or radiculopathy, lumbosacral region: Secondary | ICD-10-CM | POA: Diagnosis not present

## 2017-02-02 DIAGNOSIS — M961 Postlaminectomy syndrome, not elsewhere classified: Secondary | ICD-10-CM | POA: Diagnosis not present

## 2017-02-02 DIAGNOSIS — M545 Low back pain: Secondary | ICD-10-CM | POA: Diagnosis not present

## 2017-02-02 DIAGNOSIS — Z79891 Long term (current) use of opiate analgesic: Secondary | ICD-10-CM | POA: Diagnosis not present

## 2017-04-28 DIAGNOSIS — M545 Low back pain: Secondary | ICD-10-CM | POA: Diagnosis not present

## 2017-04-28 DIAGNOSIS — Z880 Allergy status to penicillin: Secondary | ICD-10-CM | POA: Diagnosis not present

## 2017-04-28 DIAGNOSIS — M533 Sacrococcygeal disorders, not elsewhere classified: Secondary | ICD-10-CM | POA: Diagnosis not present

## 2017-04-28 DIAGNOSIS — M47817 Spondylosis without myelopathy or radiculopathy, lumbosacral region: Secondary | ICD-10-CM | POA: Diagnosis not present

## 2017-04-28 DIAGNOSIS — Z888 Allergy status to other drugs, medicaments and biological substances status: Secondary | ICD-10-CM | POA: Diagnosis not present

## 2017-04-28 DIAGNOSIS — Z981 Arthrodesis status: Secondary | ICD-10-CM | POA: Diagnosis not present

## 2017-04-28 DIAGNOSIS — G8929 Other chronic pain: Secondary | ICD-10-CM | POA: Diagnosis not present

## 2017-04-28 DIAGNOSIS — M4804 Spinal stenosis, thoracic region: Secondary | ICD-10-CM | POA: Diagnosis not present

## 2017-04-28 DIAGNOSIS — M961 Postlaminectomy syndrome, not elsewhere classified: Secondary | ICD-10-CM | POA: Diagnosis not present

## 2017-04-28 DIAGNOSIS — G894 Chronic pain syndrome: Secondary | ICD-10-CM | POA: Diagnosis not present

## 2017-05-07 DIAGNOSIS — N289 Disorder of kidney and ureter, unspecified: Secondary | ICD-10-CM | POA: Diagnosis not present

## 2017-05-07 DIAGNOSIS — E782 Mixed hyperlipidemia: Secondary | ICD-10-CM | POA: Diagnosis not present

## 2017-05-07 DIAGNOSIS — I10 Essential (primary) hypertension: Secondary | ICD-10-CM | POA: Diagnosis not present

## 2017-05-07 DIAGNOSIS — E119 Type 2 diabetes mellitus without complications: Secondary | ICD-10-CM | POA: Diagnosis not present

## 2017-05-07 DIAGNOSIS — E039 Hypothyroidism, unspecified: Secondary | ICD-10-CM | POA: Diagnosis not present

## 2017-05-14 DIAGNOSIS — E039 Hypothyroidism, unspecified: Secondary | ICD-10-CM | POA: Diagnosis not present

## 2017-05-14 DIAGNOSIS — E119 Type 2 diabetes mellitus without complications: Secondary | ICD-10-CM | POA: Diagnosis not present

## 2017-05-14 DIAGNOSIS — M7671 Peroneal tendinitis, right leg: Secondary | ICD-10-CM | POA: Diagnosis not present

## 2017-05-14 DIAGNOSIS — N289 Disorder of kidney and ureter, unspecified: Secondary | ICD-10-CM | POA: Diagnosis not present

## 2017-05-14 DIAGNOSIS — I10 Essential (primary) hypertension: Secondary | ICD-10-CM | POA: Diagnosis not present

## 2017-05-14 DIAGNOSIS — E782 Mixed hyperlipidemia: Secondary | ICD-10-CM | POA: Diagnosis not present

## 2017-05-14 DIAGNOSIS — Z6841 Body Mass Index (BMI) 40.0 and over, adult: Secondary | ICD-10-CM | POA: Diagnosis not present

## 2017-05-22 DIAGNOSIS — R399 Unspecified symptoms and signs involving the genitourinary system: Secondary | ICD-10-CM | POA: Diagnosis not present

## 2017-07-27 DIAGNOSIS — G8929 Other chronic pain: Secondary | ICD-10-CM | POA: Diagnosis not present

## 2017-07-27 DIAGNOSIS — Z79891 Long term (current) use of opiate analgesic: Secondary | ICD-10-CM | POA: Diagnosis not present

## 2017-07-27 DIAGNOSIS — M47817 Spondylosis without myelopathy or radiculopathy, lumbosacral region: Secondary | ICD-10-CM | POA: Diagnosis not present

## 2017-07-27 DIAGNOSIS — M533 Sacrococcygeal disorders, not elsewhere classified: Secondary | ICD-10-CM | POA: Diagnosis not present

## 2017-07-27 DIAGNOSIS — Z0289 Encounter for other administrative examinations: Secondary | ICD-10-CM | POA: Diagnosis not present

## 2017-07-27 DIAGNOSIS — M961 Postlaminectomy syndrome, not elsewhere classified: Secondary | ICD-10-CM | POA: Diagnosis not present

## 2017-07-27 DIAGNOSIS — M4804 Spinal stenosis, thoracic region: Secondary | ICD-10-CM | POA: Diagnosis not present

## 2017-07-27 DIAGNOSIS — G894 Chronic pain syndrome: Secondary | ICD-10-CM | POA: Diagnosis not present

## 2017-07-27 DIAGNOSIS — M545 Low back pain: Secondary | ICD-10-CM | POA: Diagnosis not present

## 2017-07-28 ENCOUNTER — Other Ambulatory Visit: Payer: Self-pay | Admitting: Family Medicine

## 2017-07-28 DIAGNOSIS — Z1231 Encounter for screening mammogram for malignant neoplasm of breast: Secondary | ICD-10-CM

## 2017-08-11 ENCOUNTER — Ambulatory Visit
Admission: RE | Admit: 2017-08-11 | Discharge: 2017-08-11 | Disposition: A | Discharge status: Home / Self Care | Payer: Medicare HMO | Source: Ambulatory Visit | Attending: Family Medicine | Admitting: Family Medicine

## 2017-08-11 DIAGNOSIS — Z1231 Encounter for screening mammogram for malignant neoplasm of breast: Secondary | ICD-10-CM

## 2017-09-04 DIAGNOSIS — I10 Essential (primary) hypertension: Secondary | ICD-10-CM | POA: Diagnosis not present

## 2017-09-04 DIAGNOSIS — E782 Mixed hyperlipidemia: Secondary | ICD-10-CM | POA: Diagnosis not present

## 2017-09-04 DIAGNOSIS — N289 Disorder of kidney and ureter, unspecified: Secondary | ICD-10-CM | POA: Diagnosis not present

## 2017-09-04 DIAGNOSIS — E039 Hypothyroidism, unspecified: Secondary | ICD-10-CM | POA: Diagnosis not present

## 2017-09-04 DIAGNOSIS — E119 Type 2 diabetes mellitus without complications: Secondary | ICD-10-CM | POA: Diagnosis not present

## 2017-09-11 DIAGNOSIS — I10 Essential (primary) hypertension: Secondary | ICD-10-CM | POA: Diagnosis not present

## 2017-09-11 DIAGNOSIS — Z6841 Body Mass Index (BMI) 40.0 and over, adult: Secondary | ICD-10-CM | POA: Diagnosis not present

## 2017-09-11 DIAGNOSIS — N289 Disorder of kidney and ureter, unspecified: Secondary | ICD-10-CM | POA: Diagnosis not present

## 2017-09-11 DIAGNOSIS — E782 Mixed hyperlipidemia: Secondary | ICD-10-CM | POA: Diagnosis not present

## 2017-09-11 DIAGNOSIS — E119 Type 2 diabetes mellitus without complications: Secondary | ICD-10-CM | POA: Diagnosis not present

## 2017-09-11 DIAGNOSIS — E039 Hypothyroidism, unspecified: Secondary | ICD-10-CM | POA: Diagnosis not present

## 2017-10-09 IMAGING — MG MM DIGITAL SCREENING BILAT W/ TOMO W/ CAD
8 of 14 series · 8 of 30 positions shown · non-contrast
Comparison: Previous exam(s).

CLINICAL DATA: Screening.

EXAM:
2D DIGITAL SCREENING BILATERAL MAMMOGRAM WITH CAD AND ADJUNCT TOMO

[L CC (1 of 2)]
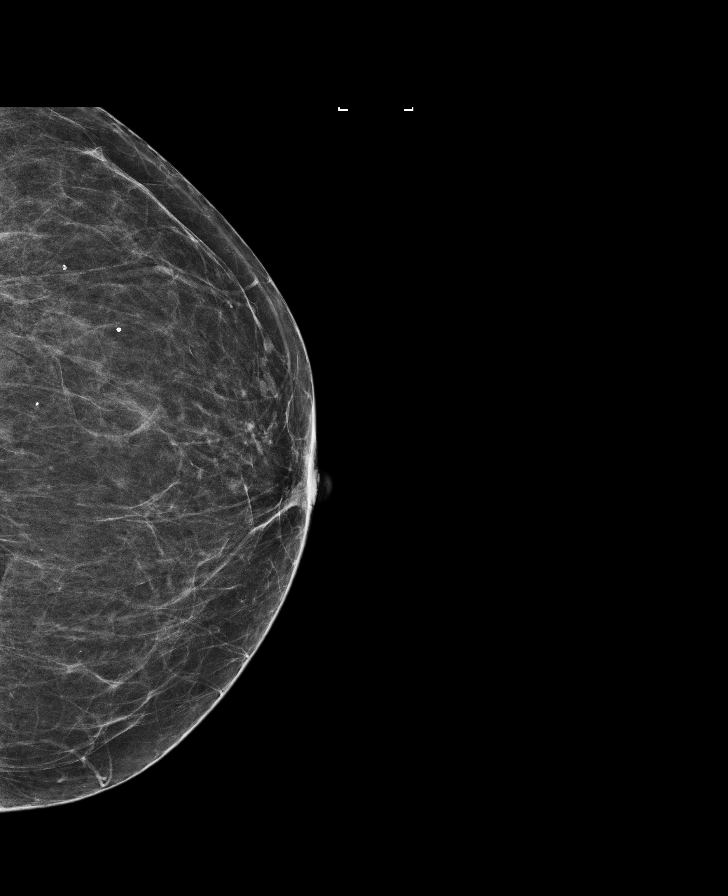

[R CC (1 of 2)]
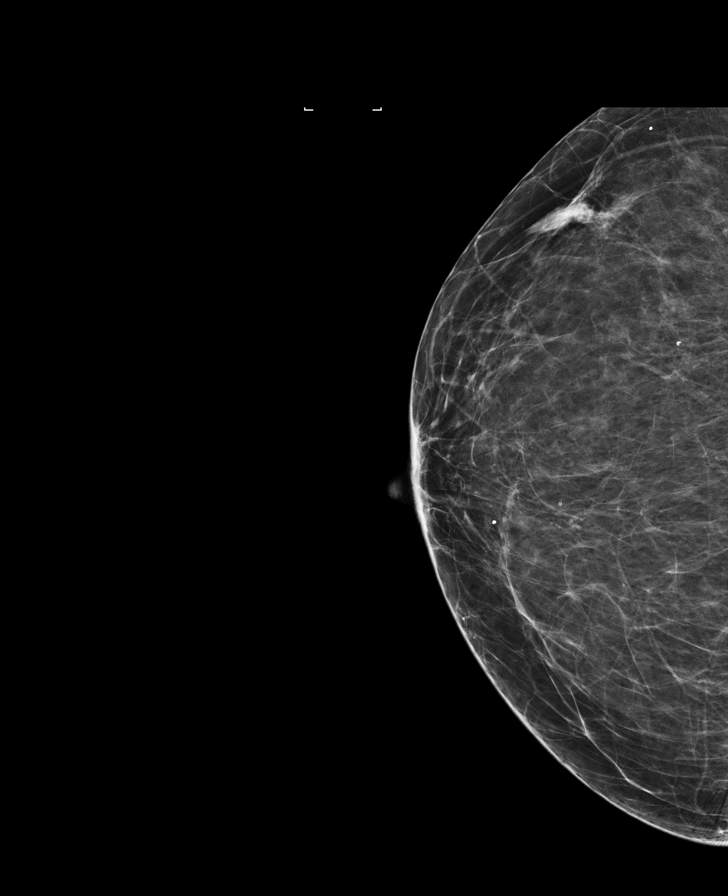

[R CC (2 of 2)]
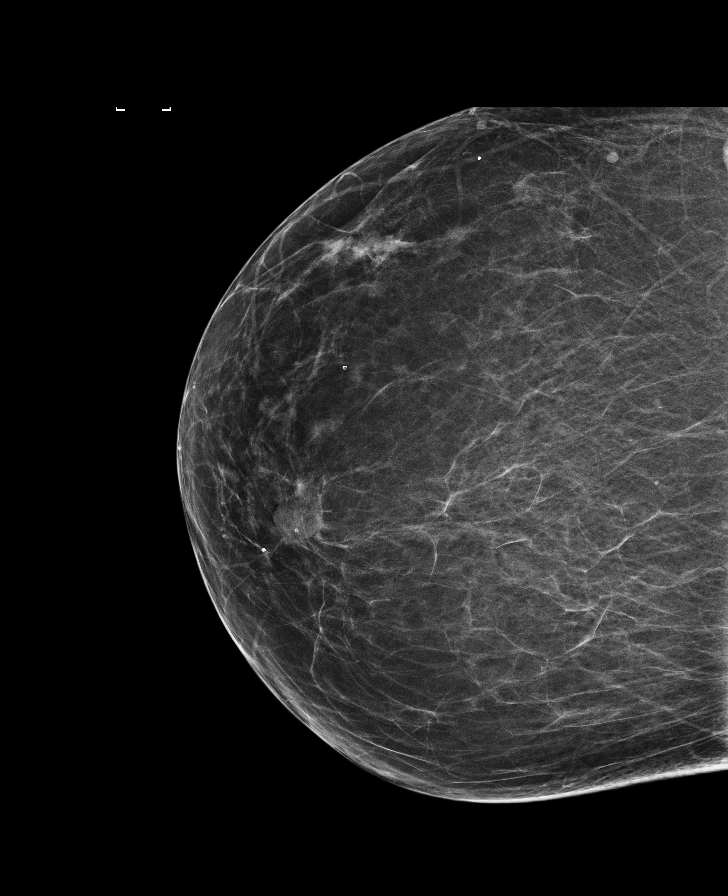

[L CC (2 of 2)]
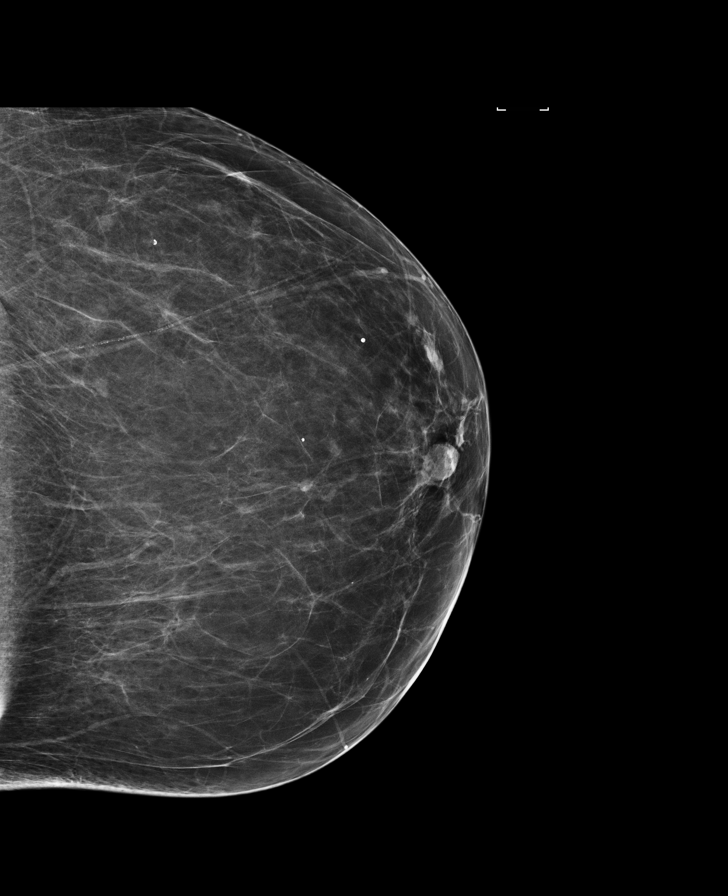

[L MLO]
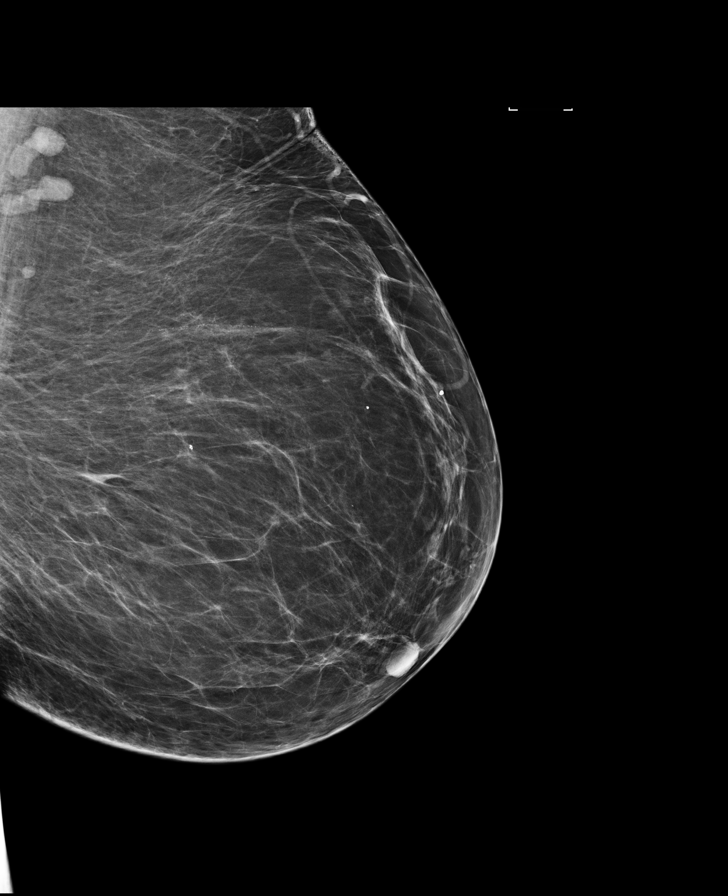

[R MLO]
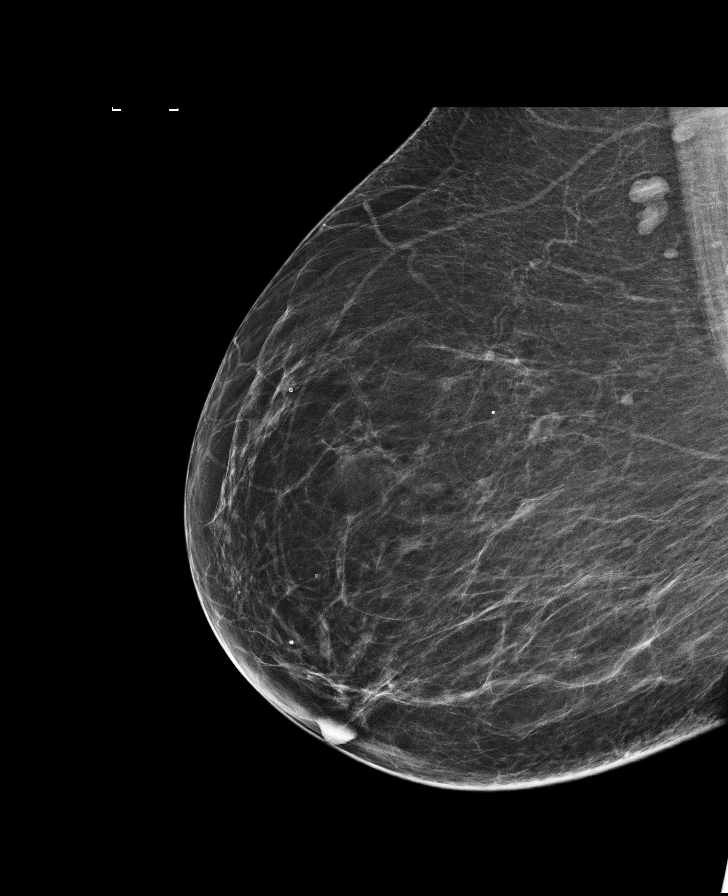

[L CC synth-2D]
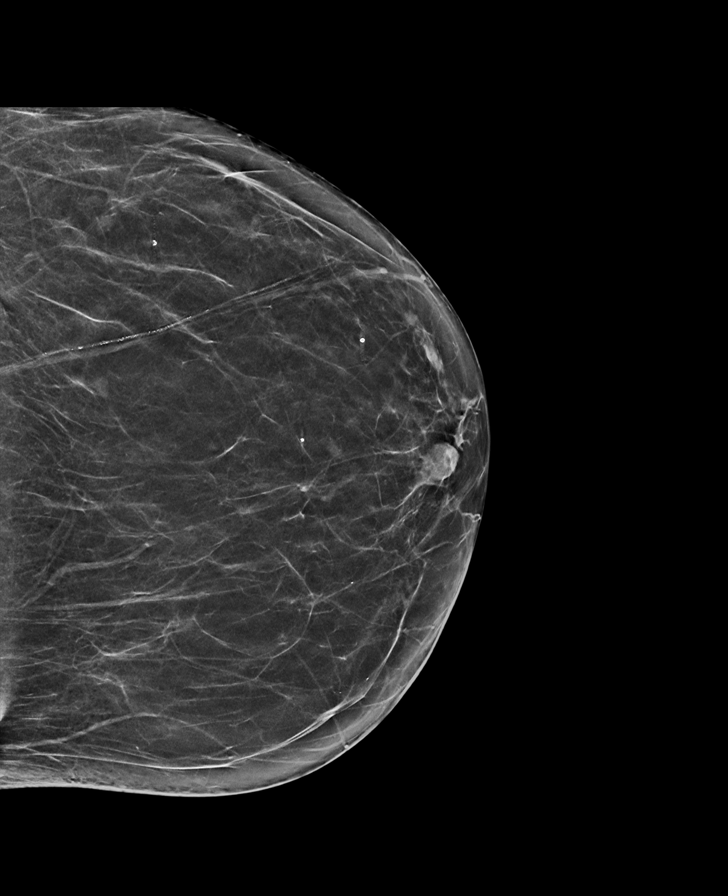

[L MLO synth-2D]
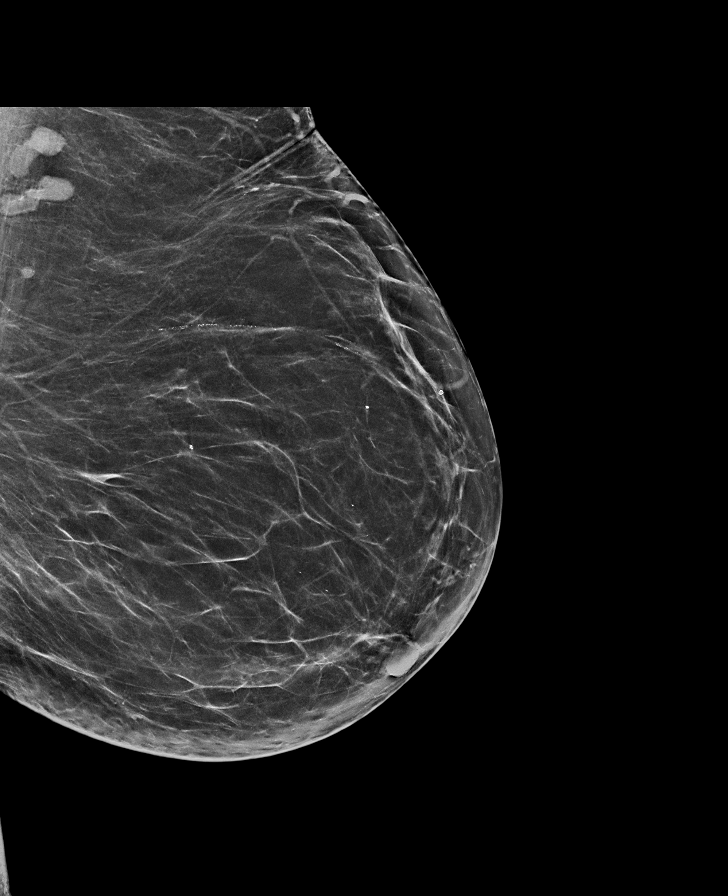

[8 of 30 positions shown; findings below may reference images not displayed]

ACR Breast Density Category b: There are scattered areas of
fibroglandular density.
FINDINGS: There are no findings suspicious for malignancy. Images were
processed with CAD.
IMPRESSION: No mammographic evidence of malignancy. A result letter of this
screening mammogram will be mailed directly to the patient.

RECOMMENDATION:
Screening mammogram in one year. (Code:97-6-RS4)

BI-RADS CATEGORY  1: Negative.

## 2017-10-19 DIAGNOSIS — M545 Low back pain: Secondary | ICD-10-CM | POA: Diagnosis not present

## 2017-10-19 DIAGNOSIS — G894 Chronic pain syndrome: Secondary | ICD-10-CM | POA: Diagnosis not present

## 2017-10-19 DIAGNOSIS — M533 Sacrococcygeal disorders, not elsewhere classified: Secondary | ICD-10-CM | POA: Diagnosis not present

## 2017-10-19 DIAGNOSIS — Z0289 Encounter for other administrative examinations: Secondary | ICD-10-CM | POA: Diagnosis not present

## 2017-10-19 DIAGNOSIS — M47817 Spondylosis without myelopathy or radiculopathy, lumbosacral region: Secondary | ICD-10-CM | POA: Diagnosis not present

## 2017-10-19 DIAGNOSIS — M4804 Spinal stenosis, thoracic region: Secondary | ICD-10-CM | POA: Diagnosis not present

## 2017-10-19 DIAGNOSIS — G8929 Other chronic pain: Secondary | ICD-10-CM | POA: Diagnosis not present

## 2017-10-19 DIAGNOSIS — M961 Postlaminectomy syndrome, not elsewhere classified: Secondary | ICD-10-CM | POA: Diagnosis not present

## 2017-10-19 DIAGNOSIS — Z79891 Long term (current) use of opiate analgesic: Secondary | ICD-10-CM | POA: Diagnosis not present

## 2018-01-06 DIAGNOSIS — E119 Type 2 diabetes mellitus without complications: Secondary | ICD-10-CM | POA: Diagnosis not present

## 2018-01-06 DIAGNOSIS — I10 Essential (primary) hypertension: Secondary | ICD-10-CM | POA: Diagnosis not present

## 2018-01-06 DIAGNOSIS — E782 Mixed hyperlipidemia: Secondary | ICD-10-CM | POA: Diagnosis not present

## 2018-01-06 DIAGNOSIS — N289 Disorder of kidney and ureter, unspecified: Secondary | ICD-10-CM | POA: Diagnosis not present

## 2018-01-06 DIAGNOSIS — E039 Hypothyroidism, unspecified: Secondary | ICD-10-CM | POA: Diagnosis not present

## 2018-01-13 DIAGNOSIS — E039 Hypothyroidism, unspecified: Secondary | ICD-10-CM | POA: Diagnosis not present

## 2018-01-13 DIAGNOSIS — Z6841 Body Mass Index (BMI) 40.0 and over, adult: Secondary | ICD-10-CM | POA: Diagnosis not present

## 2018-01-13 DIAGNOSIS — Z0000 Encounter for general adult medical examination without abnormal findings: Secondary | ICD-10-CM | POA: Diagnosis not present

## 2018-01-13 DIAGNOSIS — N183 Chronic kidney disease, stage 3 (moderate): Secondary | ICD-10-CM | POA: Diagnosis not present

## 2018-01-13 DIAGNOSIS — E782 Mixed hyperlipidemia: Secondary | ICD-10-CM | POA: Diagnosis not present

## 2018-01-13 DIAGNOSIS — I10 Essential (primary) hypertension: Secondary | ICD-10-CM | POA: Diagnosis not present

## 2018-01-13 DIAGNOSIS — E1122 Type 2 diabetes mellitus with diabetic chronic kidney disease: Secondary | ICD-10-CM | POA: Diagnosis not present

## 2018-01-13 DIAGNOSIS — N289 Disorder of kidney and ureter, unspecified: Secondary | ICD-10-CM | POA: Diagnosis not present

## 2018-01-15 DIAGNOSIS — Z79891 Long term (current) use of opiate analgesic: Secondary | ICD-10-CM | POA: Diagnosis not present

## 2018-01-15 DIAGNOSIS — M545 Low back pain: Secondary | ICD-10-CM | POA: Diagnosis not present

## 2018-01-15 DIAGNOSIS — M4804 Spinal stenosis, thoracic region: Secondary | ICD-10-CM | POA: Diagnosis not present

## 2018-01-15 DIAGNOSIS — G8929 Other chronic pain: Secondary | ICD-10-CM | POA: Diagnosis not present

## 2018-01-15 DIAGNOSIS — M961 Postlaminectomy syndrome, not elsewhere classified: Secondary | ICD-10-CM | POA: Diagnosis not present

## 2018-01-15 DIAGNOSIS — M533 Sacrococcygeal disorders, not elsewhere classified: Secondary | ICD-10-CM | POA: Diagnosis not present

## 2018-01-15 DIAGNOSIS — G894 Chronic pain syndrome: Secondary | ICD-10-CM | POA: Diagnosis not present

## 2018-01-15 DIAGNOSIS — M47817 Spondylosis without myelopathy or radiculopathy, lumbosacral region: Secondary | ICD-10-CM | POA: Diagnosis not present

## 2018-02-24 DIAGNOSIS — M5431 Sciatica, right side: Secondary | ICD-10-CM | POA: Diagnosis not present

## 2018-04-20 DIAGNOSIS — Z79891 Long term (current) use of opiate analgesic: Secondary | ICD-10-CM | POA: Diagnosis not present

## 2018-04-20 DIAGNOSIS — M545 Low back pain: Secondary | ICD-10-CM | POA: Diagnosis not present

## 2018-04-20 DIAGNOSIS — G8929 Other chronic pain: Secondary | ICD-10-CM | POA: Diagnosis not present

## 2018-04-20 DIAGNOSIS — M4804 Spinal stenosis, thoracic region: Secondary | ICD-10-CM | POA: Diagnosis not present

## 2018-04-20 DIAGNOSIS — G894 Chronic pain syndrome: Secondary | ICD-10-CM | POA: Diagnosis not present

## 2018-04-20 DIAGNOSIS — M533 Sacrococcygeal disorders, not elsewhere classified: Secondary | ICD-10-CM | POA: Diagnosis not present

## 2018-04-20 DIAGNOSIS — M47817 Spondylosis without myelopathy or radiculopathy, lumbosacral region: Secondary | ICD-10-CM | POA: Diagnosis not present

## 2018-04-20 DIAGNOSIS — M961 Postlaminectomy syndrome, not elsewhere classified: Secondary | ICD-10-CM | POA: Diagnosis not present

## 2018-07-07 DIAGNOSIS — N289 Disorder of kidney and ureter, unspecified: Secondary | ICD-10-CM | POA: Diagnosis not present

## 2018-07-07 DIAGNOSIS — E039 Hypothyroidism, unspecified: Secondary | ICD-10-CM | POA: Diagnosis not present

## 2018-07-07 DIAGNOSIS — E782 Mixed hyperlipidemia: Secondary | ICD-10-CM | POA: Diagnosis not present

## 2018-07-07 DIAGNOSIS — N183 Chronic kidney disease, stage 3 (moderate): Secondary | ICD-10-CM | POA: Diagnosis not present

## 2018-07-07 DIAGNOSIS — E1122 Type 2 diabetes mellitus with diabetic chronic kidney disease: Secondary | ICD-10-CM | POA: Diagnosis not present

## 2018-07-07 DIAGNOSIS — I10 Essential (primary) hypertension: Secondary | ICD-10-CM | POA: Diagnosis not present

## 2018-07-14 DIAGNOSIS — N183 Chronic kidney disease, stage 3 (moderate): Secondary | ICD-10-CM | POA: Diagnosis not present

## 2018-07-14 DIAGNOSIS — I10 Essential (primary) hypertension: Secondary | ICD-10-CM | POA: Diagnosis not present

## 2018-07-14 DIAGNOSIS — E039 Hypothyroidism, unspecified: Secondary | ICD-10-CM | POA: Diagnosis not present

## 2018-07-14 DIAGNOSIS — E1122 Type 2 diabetes mellitus with diabetic chronic kidney disease: Secondary | ICD-10-CM | POA: Diagnosis not present

## 2018-07-14 DIAGNOSIS — Z6839 Body mass index (BMI) 39.0-39.9, adult: Secondary | ICD-10-CM | POA: Diagnosis not present

## 2018-07-14 DIAGNOSIS — E782 Mixed hyperlipidemia: Secondary | ICD-10-CM | POA: Diagnosis not present

## 2018-07-16 DIAGNOSIS — M533 Sacrococcygeal disorders, not elsewhere classified: Secondary | ICD-10-CM | POA: Diagnosis not present

## 2018-07-16 DIAGNOSIS — M4804 Spinal stenosis, thoracic region: Secondary | ICD-10-CM | POA: Diagnosis not present

## 2018-07-16 DIAGNOSIS — Z79891 Long term (current) use of opiate analgesic: Secondary | ICD-10-CM | POA: Diagnosis not present

## 2018-07-16 DIAGNOSIS — M47817 Spondylosis without myelopathy or radiculopathy, lumbosacral region: Secondary | ICD-10-CM | POA: Diagnosis not present

## 2018-07-16 DIAGNOSIS — M545 Low back pain: Secondary | ICD-10-CM | POA: Diagnosis not present

## 2018-07-16 DIAGNOSIS — G8929 Other chronic pain: Secondary | ICD-10-CM | POA: Diagnosis not present

## 2018-07-16 DIAGNOSIS — M961 Postlaminectomy syndrome, not elsewhere classified: Secondary | ICD-10-CM | POA: Diagnosis not present

## 2018-08-27 DIAGNOSIS — G8929 Other chronic pain: Secondary | ICD-10-CM | POA: Diagnosis not present

## 2018-08-27 DIAGNOSIS — M4804 Spinal stenosis, thoracic region: Secondary | ICD-10-CM | POA: Diagnosis not present

## 2018-08-27 DIAGNOSIS — Z79891 Long term (current) use of opiate analgesic: Secondary | ICD-10-CM | POA: Diagnosis not present

## 2018-08-27 DIAGNOSIS — M961 Postlaminectomy syndrome, not elsewhere classified: Secondary | ICD-10-CM | POA: Diagnosis not present

## 2018-08-27 DIAGNOSIS — M545 Low back pain: Secondary | ICD-10-CM | POA: Diagnosis not present

## 2018-08-27 DIAGNOSIS — M533 Sacrococcygeal disorders, not elsewhere classified: Secondary | ICD-10-CM | POA: Diagnosis not present

## 2018-08-27 DIAGNOSIS — M47817 Spondylosis without myelopathy or radiculopathy, lumbosacral region: Secondary | ICD-10-CM | POA: Diagnosis not present

## 2018-10-20 DIAGNOSIS — M533 Sacrococcygeal disorders, not elsewhere classified: Secondary | ICD-10-CM | POA: Diagnosis not present

## 2018-10-20 DIAGNOSIS — M961 Postlaminectomy syndrome, not elsewhere classified: Secondary | ICD-10-CM | POA: Diagnosis not present

## 2018-10-20 DIAGNOSIS — M47817 Spondylosis without myelopathy or radiculopathy, lumbosacral region: Secondary | ICD-10-CM | POA: Diagnosis not present

## 2018-10-20 DIAGNOSIS — M545 Low back pain: Secondary | ICD-10-CM | POA: Diagnosis not present

## 2018-10-20 DIAGNOSIS — Z79891 Long term (current) use of opiate analgesic: Secondary | ICD-10-CM | POA: Diagnosis not present

## 2018-10-20 DIAGNOSIS — M25561 Pain in right knee: Secondary | ICD-10-CM | POA: Diagnosis not present

## 2018-10-20 DIAGNOSIS — G8929 Other chronic pain: Secondary | ICD-10-CM | POA: Diagnosis not present

## 2018-10-20 DIAGNOSIS — M4804 Spinal stenosis, thoracic region: Secondary | ICD-10-CM | POA: Diagnosis not present

## 2019-01-17 DIAGNOSIS — I10 Essential (primary) hypertension: Secondary | ICD-10-CM | POA: Diagnosis not present

## 2019-01-17 DIAGNOSIS — E039 Hypothyroidism, unspecified: Secondary | ICD-10-CM | POA: Diagnosis not present

## 2019-01-17 DIAGNOSIS — E1122 Type 2 diabetes mellitus with diabetic chronic kidney disease: Secondary | ICD-10-CM | POA: Diagnosis not present

## 2019-01-17 DIAGNOSIS — E782 Mixed hyperlipidemia: Secondary | ICD-10-CM | POA: Diagnosis not present

## 2019-01-17 DIAGNOSIS — N183 Chronic kidney disease, stage 3 (moderate): Secondary | ICD-10-CM | POA: Diagnosis not present

## 2019-01-21 DIAGNOSIS — M533 Sacrococcygeal disorders, not elsewhere classified: Secondary | ICD-10-CM | POA: Diagnosis not present

## 2019-01-21 DIAGNOSIS — M47817 Spondylosis without myelopathy or radiculopathy, lumbosacral region: Secondary | ICD-10-CM | POA: Diagnosis not present

## 2019-01-21 DIAGNOSIS — M961 Postlaminectomy syndrome, not elsewhere classified: Secondary | ICD-10-CM | POA: Diagnosis not present

## 2019-01-21 DIAGNOSIS — Z79891 Long term (current) use of opiate analgesic: Secondary | ICD-10-CM | POA: Diagnosis not present

## 2019-01-21 DIAGNOSIS — G894 Chronic pain syndrome: Secondary | ICD-10-CM | POA: Diagnosis not present

## 2019-01-21 DIAGNOSIS — M4804 Spinal stenosis, thoracic region: Secondary | ICD-10-CM | POA: Diagnosis not present

## 2019-01-21 DIAGNOSIS — M545 Low back pain: Secondary | ICD-10-CM | POA: Diagnosis not present

## 2019-01-24 DIAGNOSIS — E669 Obesity, unspecified: Secondary | ICD-10-CM | POA: Diagnosis not present

## 2019-01-24 DIAGNOSIS — E119 Type 2 diabetes mellitus without complications: Secondary | ICD-10-CM | POA: Diagnosis not present

## 2019-01-24 DIAGNOSIS — N1832 Chronic kidney disease, stage 3b: Secondary | ICD-10-CM | POA: Diagnosis not present

## 2019-01-24 DIAGNOSIS — E785 Hyperlipidemia, unspecified: Secondary | ICD-10-CM | POA: Diagnosis not present

## 2019-01-24 DIAGNOSIS — Z0000 Encounter for general adult medical examination without abnormal findings: Secondary | ICD-10-CM | POA: Diagnosis not present

## 2019-01-24 DIAGNOSIS — Z6839 Body mass index (BMI) 39.0-39.9, adult: Secondary | ICD-10-CM | POA: Diagnosis not present

## 2019-01-24 DIAGNOSIS — E1121 Type 2 diabetes mellitus with diabetic nephropathy: Secondary | ICD-10-CM | POA: Diagnosis not present

## 2019-01-24 DIAGNOSIS — R030 Elevated blood-pressure reading, without diagnosis of hypertension: Secondary | ICD-10-CM | POA: Diagnosis not present

## 2019-02-07 DIAGNOSIS — H2512 Age-related nuclear cataract, left eye: Secondary | ICD-10-CM | POA: Diagnosis not present

## 2019-02-16 DIAGNOSIS — H2512 Age-related nuclear cataract, left eye: Secondary | ICD-10-CM | POA: Diagnosis not present

## 2019-02-16 DIAGNOSIS — E1159 Type 2 diabetes mellitus with other circulatory complications: Secondary | ICD-10-CM | POA: Diagnosis not present

## 2019-02-22 ENCOUNTER — Other Ambulatory Visit: Payer: Self-pay

## 2019-02-22 ENCOUNTER — Encounter: Payer: Self-pay | Admitting: *Deleted

## 2019-02-25 ENCOUNTER — Other Ambulatory Visit
Admission: RE | Admit: 2019-02-25 | Discharge: 2019-02-25 | Disposition: A | Discharge status: Home / Self Care | Payer: Medicare HMO | Source: Ambulatory Visit | Attending: Ophthalmology | Admitting: Ophthalmology

## 2019-02-25 ENCOUNTER — Other Ambulatory Visit: Payer: Self-pay

## 2019-02-25 DIAGNOSIS — Z01812 Encounter for preprocedural laboratory examination: Secondary | ICD-10-CM | POA: Diagnosis not present

## 2019-02-25 DIAGNOSIS — Z20828 Contact with and (suspected) exposure to other viral communicable diseases: Secondary | ICD-10-CM | POA: Diagnosis not present

## 2019-02-25 LAB — SARS CORONAVIRUS 2 (TAT 6-24 HRS): SARS Coronavirus 2: NEGATIVE

## 2019-02-25 NOTE — Discharge Instructions (Signed)

## 2019-03-01 ENCOUNTER — Ambulatory Visit
Admission: RE | Admit: 2019-03-01 | Discharge: 2019-03-01 | Disposition: A | Discharge status: Home / Self Care | Payer: Medicare HMO | Attending: Ophthalmology | Admitting: Ophthalmology

## 2019-03-01 ENCOUNTER — Other Ambulatory Visit: Payer: Self-pay

## 2019-03-01 ENCOUNTER — Ambulatory Visit: Payer: Medicare HMO | Admitting: Anesthesiology

## 2019-03-01 ENCOUNTER — Encounter
Admission: RE | Disposition: A | Discharge status: Home / Self Care | Payer: Self-pay | Source: Home / Self Care | Attending: Ophthalmology

## 2019-03-01 DIAGNOSIS — I10 Essential (primary) hypertension: Secondary | ICD-10-CM | POA: Diagnosis not present

## 2019-03-01 DIAGNOSIS — Z9849 Cataract extraction status, unspecified eye: Secondary | ICD-10-CM | POA: Diagnosis not present

## 2019-03-01 DIAGNOSIS — E1136 Type 2 diabetes mellitus with diabetic cataract: Secondary | ICD-10-CM | POA: Insufficient documentation

## 2019-03-01 DIAGNOSIS — E7800 Pure hypercholesterolemia, unspecified: Secondary | ICD-10-CM | POA: Insufficient documentation

## 2019-03-01 DIAGNOSIS — M549 Dorsalgia, unspecified: Secondary | ICD-10-CM | POA: Insufficient documentation

## 2019-03-01 DIAGNOSIS — G8929 Other chronic pain: Secondary | ICD-10-CM | POA: Insufficient documentation

## 2019-03-01 DIAGNOSIS — Z6838 Body mass index (BMI) 38.0-38.9, adult: Secondary | ICD-10-CM | POA: Diagnosis not present

## 2019-03-01 DIAGNOSIS — R011 Cardiac murmur, unspecified: Secondary | ICD-10-CM | POA: Insufficient documentation

## 2019-03-01 DIAGNOSIS — H25812 Combined forms of age-related cataract, left eye: Secondary | ICD-10-CM | POA: Diagnosis not present

## 2019-03-01 DIAGNOSIS — Z9049 Acquired absence of other specified parts of digestive tract: Secondary | ICD-10-CM | POA: Diagnosis not present

## 2019-03-01 DIAGNOSIS — M1990 Unspecified osteoarthritis, unspecified site: Secondary | ICD-10-CM | POA: Insufficient documentation

## 2019-03-01 DIAGNOSIS — Z90710 Acquired absence of both cervix and uterus: Secondary | ICD-10-CM | POA: Insufficient documentation

## 2019-03-01 DIAGNOSIS — H2512 Age-related nuclear cataract, left eye: Secondary | ICD-10-CM | POA: Insufficient documentation

## 2019-03-01 DIAGNOSIS — Z880 Allergy status to penicillin: Secondary | ICD-10-CM | POA: Diagnosis not present

## 2019-03-01 HISTORY — DX: Pure hypercholesterolemia, unspecified: E78.00

## 2019-03-01 HISTORY — PX: CATARACT EXTRACTION W/PHACO: SHX586

## 2019-03-01 HISTORY — DX: Cardiac murmur, unspecified: R01.1

## 2019-03-01 HISTORY — DX: Unspecified osteoarthritis, unspecified site: M19.90

## 2019-03-01 LAB — GLUCOSE, CAPILLARY
Glucose-Capillary: 93 mg/dL (ref 70–99)
Glucose-Capillary: 104 mg/dL — ABNORMAL HIGH (ref 70–99)

## 2019-03-01 SURGERY — PHACOEMULSIFICATION, CATARACT, WITH IOL INSERTION
Anesthesia: Monitor Anesthesia Care | Site: Eye | Laterality: Left

## 2019-03-01 MED ORDER — LIDOCAINE HCL (PF) 2 % IJ SOLN
INTRAOCULAR | Status: DC | PRN
  Administered 2019-03-01: 08:00:00 2 mL

## 2019-03-01 MED ORDER — BRIMONIDINE TARTRATE-TIMOLOL 0.2-0.5 % OP SOLN
OPHTHALMIC | Status: DC | PRN
  Administered 2019-03-01: 1 [drp] via OPHTHALMIC

## 2019-03-01 MED ORDER — NA CHONDROIT SULF-NA HYALURON 40-17 MG/ML IO SOLN
INTRAOCULAR | Status: DC | PRN
  Administered 2019-03-01: 1 mL via INTRAOCULAR

## 2019-03-01 MED ORDER — OXYCODONE HCL 5 MG PO TABS: 5.0000 mg | ORAL_TABLET | Freq: Once | ORAL | Status: DC | PRN

## 2019-03-01 MED ORDER — TETRACAINE HCL 0.5 % OP SOLN
1.0000 [drp] | OPHTHALMIC | Status: DC | PRN
  Administered 2019-03-01 (×3): 1 [drp] via OPHTHALMIC

## 2019-03-01 MED ORDER — OXYCODONE HCL 5 MG/5ML PO SOLN: 5.0000 mg | Freq: Once | ORAL | Status: DC | PRN

## 2019-03-01 MED ORDER — EPINEPHRINE PF 1 MG/ML IJ SOLN
INTRAOCULAR | Status: DC | PRN
  Administered 2019-03-01: 08:00:00 69 mL via OPHTHALMIC

## 2019-03-01 MED ORDER — ARMC OPHTHALMIC DILATING DROPS
1.0000 "application " | OPHTHALMIC | Status: DC | PRN
  Administered 2019-03-01 (×3): 1 via OPHTHALMIC

## 2019-03-01 MED ORDER — MOXIFLOXACIN HCL 0.5 % OP SOLN
OPHTHALMIC | Status: DC | PRN
  Administered 2019-03-01: 0.2 mL via OPHTHALMIC

## 2019-03-01 MED ORDER — FENTANYL CITRATE (PF) 100 MCG/2ML IJ SOLN
INTRAMUSCULAR | Status: DC | PRN
  Administered 2019-03-01: 50 ug via INTRAVENOUS

## 2019-03-01 MED ORDER — MIDAZOLAM HCL 2 MG/2ML IJ SOLN
INTRAMUSCULAR | Status: DC | PRN
  Administered 2019-03-01: 1 mg via INTRAVENOUS

## 2019-03-01 SURGICAL SUPPLY — 20 items
CANNULA ANT/CHMB 27G (MISCELLANEOUS) ×2 IMPLANT
CANNULA ANT/CHMB 27GA (MISCELLANEOUS) ×6 IMPLANT
GLOVE SURG LX 8.0 MICRO (GLOVE) ×2
GLOVE SURG LX STRL 8.0 MICRO (GLOVE) ×1 IMPLANT
GLOVE SURG TRIUMPH 8.0 PF LTX (GLOVE) ×3 IMPLANT
GOWN STRL REUS W/ TWL LRG LVL3 (GOWN DISPOSABLE) ×2 IMPLANT
GOWN STRL REUS W/TWL LRG LVL3 (GOWN DISPOSABLE) ×4
LENS IOL TECNIS ITEC 20.5 (Intraocular Lens) ×2 IMPLANT
MARKER SKIN DUAL TIP RULER LAB (MISCELLANEOUS) ×3 IMPLANT
NDL FILTER BLUNT 18X1 1/2 (NEEDLE) ×1 IMPLANT
NDL RETROBULBAR .5 NSTRL (NEEDLE) ×3 IMPLANT
NEEDLE FILTER BLUNT 18X 1/2SAF (NEEDLE) ×2
NEEDLE FILTER BLUNT 18X1 1/2 (NEEDLE) ×1 IMPLANT
PACK EYE AFTER SURG (MISCELLANEOUS) ×3 IMPLANT
PACK OPTHALMIC (MISCELLANEOUS) ×3 IMPLANT
PACK PORFILIO (MISCELLANEOUS) ×3 IMPLANT
SYR 3ML LL SCALE MARK (SYRINGE) ×3 IMPLANT
SYR TB 1ML LUER SLIP (SYRINGE) ×3 IMPLANT
WATER STERILE IRR 250ML POUR (IV SOLUTION) ×3 IMPLANT
WIPE NON LINTING 3.25X3.25 (MISCELLANEOUS) ×3 IMPLANT

## 2019-03-01 NOTE — Anesthesia Preprocedure Evaluation (Signed)
Anesthesia Evaluation  Patient identified by MRN, date of birth, ID band Patient awake    Reviewed: NPO status   History of Anesthesia Complications Negative for: history of anesthetic complications  Airway Mallampati: II  TM Distance: >3 FB Neck ROM: full    Dental no notable dental hx.    Pulmonary neg pulmonary ROS,    Pulmonary exam normal        Cardiovascular Exercise Tolerance: Good hypertension, Normal cardiovascular exam+ Valvular Problems/Murmurs (benign)      Neuro/Psych Chronic back pain negative psych ROS   GI/Hepatic negative GI ROS, Neg liver ROS,   Endo/Other  diabetesHypothyroidism Morbid obesity (bmi=38)  Renal/GU negative Renal ROS  negative genitourinary   Musculoskeletal  (+) Arthritis ,   Abdominal   Peds  Hematology negative hematology ROS (+)   Anesthesia Other Findings Covid: NEG.  Reproductive/Obstetrics                             Anesthesia Physical Anesthesia Plan  ASA: II  Anesthesia Plan: MAC   Post-op Pain Management:    Induction:   PONV Risk Score and Plan: 2 and Midazolam and TIVA  Airway Management Planned:   Additional Equipment:   Intra-op Plan:   Post-operative Plan:   Informed Consent: I have reviewed the patients History and Physical, chart, labs and discussed the procedure including the risks, benefits and alternatives for the proposed anesthesia with the patient or authorized representative who has indicated his/her understanding and acceptance.       Plan Discussed with: CRNA  Anesthesia Plan Comments:         Anesthesia Quick Evaluation

## 2019-03-01 NOTE — H&P (Signed)
All labs reviewed. Abnormal studies sent to patients PCP when indicated.  Previous H&P reviewed, patient examined, there are NO CHANGES.  Cynthia Forgione Porfilio11/10/20207:19 AM

## 2019-03-01 NOTE — Op Note (Signed)
PREOPERATIVE DIAGNOSIS:  Nuclear sclerotic cataract of the left eye.   POSTOPERATIVE DIAGNOSIS:  Nuclear sclerotic cataract of the left eye.   OPERATIVE PROCEDURE:@   SURGEON:  Birder Robson, MD.   ANESTHESIA:  Anesthesiologist: Fidel Levy, MD CRNA: Cameron Ali, CRNA  1.      Managed anesthesia care. 2.     0.25ml of Shugarcaine was instilled following the paracentesis   COMPLICATIONS:  None.   TECHNIQUE:   Stop and chop   DESCRIPTION OF PROCEDURE:  The patient was examined and consented in the preoperative holding area where the aforementioned topical anesthesia was applied to the left eye and then brought back to the Operating Room where the left eye was prepped and draped in the usual sterile ophthalmic fashion and a lid speculum was placed. A paracentesis was created with the side port blade and the anterior chamber was filled with viscoelastic. A near clear corneal incision was performed with the steel keratome. A continuous curvilinear capsulorrhexis was performed with a cystotome followed by the capsulorrhexis forceps. Hydrodissection and hydrodelineation were carried out with BSS on a blunt cannula. The lens was removed in a stop and chop  technique and the remaining cortical material was removed with the irrigation-aspiration handpiece. The capsular bag was inflated with viscoelastic and the Technis [id number] lens was placed in the capsular bag without complication. The remaining viscoelastic was removed from the eye with the irrigation-aspiration handpiece. The wounds were hydrated. The anterior chamber was flushed with BSS and the eye was inflated to physiologic pressure. 0.81ml Vigamox was placed in the anterior chamber. The wounds were found to be water tight. The eye was dressed with Combigan. The patient was given protective glasses to wear throughout the day and a shield with which to sleep tonight. The patient was also given drops with which to begin a drop regimen today and  will follow-up with me in one day. Implant Name Type Inv. Item Serial No. Manufacturer Lot No. LRB No. Used Action  LENS IOL DIOP 20.5 - [id number] Intraocular Lens LENS IOL DIOP 20.5 [id number]  Left 1 Implanted    Procedure(s) with comments: CATARACT EXTRACTION PHACO AND INTRAOCULAR LENS PLACEMENT (IOC) LEFT DIABETIC 00:58.8          17.7%           10.40 (Left) - Diabetic - oral meds  Electronically signed: Birder Robson 03/01/2019 7:47 AM

## 2019-03-01 NOTE — Transfer of Care (Signed)
Immediate Anesthesia Transfer of Care Note  Patient: Cynthia Dawson  Procedure(s) Performed: CATARACT EXTRACTION PHACO AND INTRAOCULAR LENS PLACEMENT (IOC) LEFT DIABETIC 00:58.8          17.7%           10.40 (Left Eye)  Patient Location: PACU  Anesthesia Type: MAC  Level of Consciousness: awake, alert  and patient cooperative  Airway and Oxygen Therapy: Patient Spontanous Breathing and Patient connected to supplemental oxygen  Post-op Assessment: Post-op Vital signs reviewed, Patient's Cardiovascular Status Stable, Respiratory Function Stable, Patent Airway and No signs of Nausea or vomiting  Post-op Vital Signs: Reviewed and stable  Complications: No apparent anesthesia complications

## 2019-03-01 NOTE — Anesthesia Postprocedure Evaluation (Signed)
Anesthesia Post Note  Patient: Cynthia Dawson  Procedure(s) Performed: CATARACT EXTRACTION PHACO AND INTRAOCULAR LENS PLACEMENT (IOC) LEFT DIABETIC 00:58.8          17.7%           10.40 (Left Eye)     Patient location during evaluation: PACU Anesthesia Type: MAC Level of consciousness: awake and alert Pain management: pain level controlled Vital Signs Assessment: post-procedure vital signs reviewed and stable Respiratory status: spontaneous breathing, nonlabored ventilation, respiratory function stable and patient connected to nasal cannula oxygen Cardiovascular status: stable and blood pressure returned to baseline Postop Assessment: no apparent nausea or vomiting Anesthetic complications: no    Mearl Harewood

## 2019-03-01 NOTE — Anesthesia Procedure Notes (Signed)
Procedure Name: MAC Date/Time: 03/01/2019 7:27 AM Performed by: Cameron Ali, CRNA Pre-anesthesia Checklist: Patient identified, Emergency Drugs available, Suction available, Timeout performed and Patient being monitored Patient Re-evaluated:Patient Re-evaluated prior to induction Oxygen Delivery Method: Nasal cannula Placement Confirmation: positive ETCO2

## 2019-03-02 ENCOUNTER — Encounter: Payer: Self-pay | Admitting: Ophthalmology

## 2019-04-18 DIAGNOSIS — M533 Sacrococcygeal disorders, not elsewhere classified: Secondary | ICD-10-CM | POA: Diagnosis not present

## 2019-04-18 DIAGNOSIS — M961 Postlaminectomy syndrome, not elsewhere classified: Secondary | ICD-10-CM | POA: Diagnosis not present

## 2019-04-18 DIAGNOSIS — G8929 Other chronic pain: Secondary | ICD-10-CM | POA: Diagnosis not present

## 2019-04-18 DIAGNOSIS — M545 Low back pain: Secondary | ICD-10-CM | POA: Diagnosis not present

## 2019-04-18 DIAGNOSIS — Z79891 Long term (current) use of opiate analgesic: Secondary | ICD-10-CM | POA: Diagnosis not present

## 2019-04-18 DIAGNOSIS — G894 Chronic pain syndrome: Secondary | ICD-10-CM | POA: Diagnosis not present

## 2019-04-18 DIAGNOSIS — M4804 Spinal stenosis, thoracic region: Secondary | ICD-10-CM | POA: Diagnosis not present

## 2019-04-18 DIAGNOSIS — M47817 Spondylosis without myelopathy or radiculopathy, lumbosacral region: Secondary | ICD-10-CM | POA: Diagnosis not present

## 2019-07-15 DIAGNOSIS — Z79891 Long term (current) use of opiate analgesic: Secondary | ICD-10-CM | POA: Diagnosis not present

## 2019-07-15 DIAGNOSIS — M545 Low back pain: Secondary | ICD-10-CM | POA: Diagnosis not present

## 2019-07-15 DIAGNOSIS — G894 Chronic pain syndrome: Secondary | ICD-10-CM | POA: Diagnosis not present

## 2019-07-15 DIAGNOSIS — M4804 Spinal stenosis, thoracic region: Secondary | ICD-10-CM | POA: Diagnosis not present

## 2019-07-15 DIAGNOSIS — M961 Postlaminectomy syndrome, not elsewhere classified: Secondary | ICD-10-CM | POA: Diagnosis not present

## 2019-07-15 DIAGNOSIS — M533 Sacrococcygeal disorders, not elsewhere classified: Secondary | ICD-10-CM | POA: Diagnosis not present

## 2019-07-15 DIAGNOSIS — M47817 Spondylosis without myelopathy or radiculopathy, lumbosacral region: Secondary | ICD-10-CM | POA: Diagnosis not present

## 2019-07-15 DIAGNOSIS — Z0289 Encounter for other administrative examinations: Secondary | ICD-10-CM | POA: Diagnosis not present

## 2019-07-15 DIAGNOSIS — G8929 Other chronic pain: Secondary | ICD-10-CM | POA: Diagnosis not present

## 2019-07-18 DIAGNOSIS — E039 Hypothyroidism, unspecified: Secondary | ICD-10-CM | POA: Diagnosis not present

## 2019-07-18 DIAGNOSIS — E782 Mixed hyperlipidemia: Secondary | ICD-10-CM | POA: Diagnosis not present

## 2019-07-18 DIAGNOSIS — I10 Essential (primary) hypertension: Secondary | ICD-10-CM | POA: Diagnosis not present

## 2019-07-18 DIAGNOSIS — E1121 Type 2 diabetes mellitus with diabetic nephropathy: Secondary | ICD-10-CM | POA: Diagnosis not present

## 2019-07-21 DIAGNOSIS — E782 Mixed hyperlipidemia: Secondary | ICD-10-CM | POA: Diagnosis not present

## 2019-07-21 DIAGNOSIS — E039 Hypothyroidism, unspecified: Secondary | ICD-10-CM | POA: Diagnosis not present

## 2019-07-21 DIAGNOSIS — I129 Hypertensive chronic kidney disease with stage 1 through stage 4 chronic kidney disease, or unspecified chronic kidney disease: Secondary | ICD-10-CM | POA: Diagnosis not present

## 2019-07-21 DIAGNOSIS — E1122 Type 2 diabetes mellitus with diabetic chronic kidney disease: Secondary | ICD-10-CM | POA: Diagnosis not present

## 2019-07-21 DIAGNOSIS — N1831 Chronic kidney disease, stage 3a: Secondary | ICD-10-CM | POA: Diagnosis not present

## 2019-07-21 DIAGNOSIS — Z6839 Body mass index (BMI) 39.0-39.9, adult: Secondary | ICD-10-CM | POA: Diagnosis not present

## 2019-07-21 DIAGNOSIS — M549 Dorsalgia, unspecified: Secondary | ICD-10-CM | POA: Diagnosis not present

## 2019-07-21 DIAGNOSIS — Z79899 Other long term (current) drug therapy: Secondary | ICD-10-CM | POA: Diagnosis not present

## 2019-10-07 DIAGNOSIS — M47817 Spondylosis without myelopathy or radiculopathy, lumbosacral region: Secondary | ICD-10-CM | POA: Diagnosis not present

## 2019-10-07 DIAGNOSIS — Z79891 Long term (current) use of opiate analgesic: Secondary | ICD-10-CM | POA: Diagnosis not present

## 2019-10-07 DIAGNOSIS — G894 Chronic pain syndrome: Secondary | ICD-10-CM | POA: Diagnosis not present

## 2019-10-07 DIAGNOSIS — G8929 Other chronic pain: Secondary | ICD-10-CM | POA: Diagnosis not present

## 2019-10-07 DIAGNOSIS — M545 Low back pain: Secondary | ICD-10-CM | POA: Diagnosis not present

## 2019-10-07 DIAGNOSIS — M961 Postlaminectomy syndrome, not elsewhere classified: Secondary | ICD-10-CM | POA: Diagnosis not present

## 2019-10-07 DIAGNOSIS — M533 Sacrococcygeal disorders, not elsewhere classified: Secondary | ICD-10-CM | POA: Diagnosis not present

## 2019-10-07 DIAGNOSIS — M4804 Spinal stenosis, thoracic region: Secondary | ICD-10-CM | POA: Diagnosis not present

## 2019-12-30 DIAGNOSIS — G894 Chronic pain syndrome: Secondary | ICD-10-CM | POA: Diagnosis not present

## 2019-12-30 DIAGNOSIS — M961 Postlaminectomy syndrome, not elsewhere classified: Secondary | ICD-10-CM | POA: Diagnosis not present

## 2019-12-30 DIAGNOSIS — T402X5A Adverse effect of other opioids, initial encounter: Secondary | ICD-10-CM | POA: Diagnosis not present

## 2019-12-30 DIAGNOSIS — K5903 Drug induced constipation: Secondary | ICD-10-CM | POA: Diagnosis not present

## 2019-12-30 DIAGNOSIS — Z79891 Long term (current) use of opiate analgesic: Secondary | ICD-10-CM | POA: Diagnosis not present

## 2020-01-16 DIAGNOSIS — E039 Hypothyroidism, unspecified: Secondary | ICD-10-CM | POA: Diagnosis not present

## 2020-01-16 DIAGNOSIS — E782 Mixed hyperlipidemia: Secondary | ICD-10-CM | POA: Diagnosis not present

## 2020-01-16 DIAGNOSIS — N1831 Chronic kidney disease, stage 3a: Secondary | ICD-10-CM | POA: Diagnosis not present

## 2020-01-16 DIAGNOSIS — E1122 Type 2 diabetes mellitus with diabetic chronic kidney disease: Secondary | ICD-10-CM | POA: Diagnosis not present

## 2020-01-16 DIAGNOSIS — I10 Essential (primary) hypertension: Secondary | ICD-10-CM | POA: Diagnosis not present

## 2020-02-22 DIAGNOSIS — Z0000 Encounter for general adult medical examination without abnormal findings: Secondary | ICD-10-CM | POA: Diagnosis not present

## 2020-02-22 DIAGNOSIS — E785 Hyperlipidemia, unspecified: Secondary | ICD-10-CM | POA: Diagnosis not present

## 2020-02-22 DIAGNOSIS — E119 Type 2 diabetes mellitus without complications: Secondary | ICD-10-CM | POA: Diagnosis not present

## 2020-04-04 DIAGNOSIS — Z79891 Long term (current) use of opiate analgesic: Secondary | ICD-10-CM | POA: Diagnosis not present

## 2020-04-04 DIAGNOSIS — M961 Postlaminectomy syndrome, not elsewhere classified: Secondary | ICD-10-CM | POA: Diagnosis not present

## 2020-04-04 DIAGNOSIS — G894 Chronic pain syndrome: Secondary | ICD-10-CM | POA: Diagnosis not present

## 2020-06-18 DIAGNOSIS — E1122 Type 2 diabetes mellitus with diabetic chronic kidney disease: Secondary | ICD-10-CM | POA: Diagnosis not present

## 2020-06-18 DIAGNOSIS — E782 Mixed hyperlipidemia: Secondary | ICD-10-CM | POA: Diagnosis not present

## 2020-06-18 DIAGNOSIS — N1831 Chronic kidney disease, stage 3a: Secondary | ICD-10-CM | POA: Diagnosis not present

## 2020-06-18 DIAGNOSIS — E039 Hypothyroidism, unspecified: Secondary | ICD-10-CM | POA: Diagnosis not present

## 2020-06-21 DIAGNOSIS — E1122 Type 2 diabetes mellitus with diabetic chronic kidney disease: Secondary | ICD-10-CM | POA: Diagnosis not present

## 2020-06-21 DIAGNOSIS — Z6838 Body mass index (BMI) 38.0-38.9, adult: Secondary | ICD-10-CM | POA: Diagnosis not present

## 2020-06-21 DIAGNOSIS — N1831 Chronic kidney disease, stage 3a: Secondary | ICD-10-CM | POA: Diagnosis not present

## 2020-06-21 DIAGNOSIS — G8929 Other chronic pain: Secondary | ICD-10-CM | POA: Diagnosis not present

## 2020-06-21 DIAGNOSIS — E039 Hypothyroidism, unspecified: Secondary | ICD-10-CM | POA: Diagnosis not present

## 2020-06-21 DIAGNOSIS — I10 Essential (primary) hypertension: Secondary | ICD-10-CM | POA: Diagnosis not present

## 2020-06-21 DIAGNOSIS — M5441 Lumbago with sciatica, right side: Secondary | ICD-10-CM | POA: Diagnosis not present

## 2020-06-21 DIAGNOSIS — E782 Mixed hyperlipidemia: Secondary | ICD-10-CM | POA: Diagnosis not present

## 2020-07-09 DIAGNOSIS — M545 Low back pain, unspecified: Secondary | ICD-10-CM | POA: Diagnosis not present

## 2020-07-09 DIAGNOSIS — Z79891 Long term (current) use of opiate analgesic: Secondary | ICD-10-CM | POA: Diagnosis not present

## 2020-07-09 DIAGNOSIS — M961 Postlaminectomy syndrome, not elsewhere classified: Secondary | ICD-10-CM | POA: Diagnosis not present

## 2020-07-09 DIAGNOSIS — G8929 Other chronic pain: Secondary | ICD-10-CM | POA: Diagnosis not present

## 2020-07-09 DIAGNOSIS — Z0289 Encounter for other administrative examinations: Secondary | ICD-10-CM | POA: Diagnosis not present

## 2020-07-09 DIAGNOSIS — G894 Chronic pain syndrome: Secondary | ICD-10-CM | POA: Diagnosis not present

## 2020-07-09 DIAGNOSIS — M4804 Spinal stenosis, thoracic region: Secondary | ICD-10-CM | POA: Diagnosis not present

## 2020-07-16 ENCOUNTER — Encounter: Payer: Self-pay | Admitting: Student in an Organized Health Care Education/Training Program

## 2020-07-16 ENCOUNTER — Other Ambulatory Visit: Payer: Self-pay

## 2020-07-16 ENCOUNTER — Ambulatory Visit
Payer: Medicare HMO | Attending: Student in an Organized Health Care Education/Training Program | Admitting: Student in an Organized Health Care Education/Training Program

## 2020-07-16 VITALS — BP 136/78 | HR 84 | Temp 97.4°F | Resp 16 | Ht 59.0 in | Wt 183.0 lb

## 2020-07-16 DIAGNOSIS — M549 Dorsalgia, unspecified: Secondary | ICD-10-CM | POA: Insufficient documentation

## 2020-07-16 DIAGNOSIS — E1129 Type 2 diabetes mellitus with other diabetic kidney complication: Secondary | ICD-10-CM | POA: Insufficient documentation

## 2020-07-16 DIAGNOSIS — G8929 Other chronic pain: Secondary | ICD-10-CM | POA: Insufficient documentation

## 2020-07-16 DIAGNOSIS — M961 Postlaminectomy syndrome, not elsewhere classified: Secondary | ICD-10-CM | POA: Insufficient documentation

## 2020-07-16 DIAGNOSIS — M47817 Spondylosis without myelopathy or radiculopathy, lumbosacral region: Secondary | ICD-10-CM | POA: Insufficient documentation

## 2020-07-16 DIAGNOSIS — G894 Chronic pain syndrome: Secondary | ICD-10-CM | POA: Insufficient documentation

## 2020-07-16 DIAGNOSIS — Z981 Arthrodesis status: Secondary | ICD-10-CM | POA: Insufficient documentation

## 2020-07-16 NOTE — Progress Notes (Signed)
Safety precautions to be maintained throughout the outpatient stay will include: orient to surroundings, keep bed in low position, maintain call bell within reach at all times, provide assistance with transfer out of bed and ambulation.  

## 2020-07-16 NOTE — Progress Notes (Signed)
Patient: Cynthia Dawson  Service Category: E/M  Provider: Gillis Santa, MD  DOB: [birth year]  DOS: 07/16/2020  Referring Provider: Dion Body, MD  MRN: [id number]  Setting: Ambulatory outpatient  PCP: Dion Body, MD  Type: New Patient  Specialty: Interventional Pain Management    Location: Office  Delivery: Face-to-face     Primary Reason(s) for Visit: Encounter for initial evaluation of one or more chronic problems (new to examiner) potentially causing chronic pain, and posing a threat to normal musculoskeletal function. (Level of risk: High) CC: Back Pain Marland Kitchen/Lumbar bilateral s/p surgery with titanium rods and hardware /Hx scoliosis /Dr Tessie Eke [facility]/Dr James  pain clinic )  HPI  Cynthia Dawson is a 75 y.o. year old, female patient, who comes for the first time to our practice referred by Dion Body, MD for our initial evaluation of her chronic pain. She has Chronic back pain; Chronic pain syndrome; CKD (chronic kidney disease) stage 3, GFR 30-59 ml/min (HCC); Lumbosacral spondylosis without myelopathy; Type 2 diabetes mellitus with kidney complication, without long-term current use of insulin ([place]); Postlaminectomy syndrome of lumbosacral region; Status post lumbar spinal fusion (T10-L4); and Status post thoracic spinal fusion (T10-L4) on their problem list. Today she comes in for evaluation of her Back Pain (/Lumbar bilateral s/p surgery with titanium rods and hardware /Hx scoliosis /Dr Tessie Eke [facility]/Dr Jeneen Rinks  pain clinic )  Pain Assessment: Location: Lower,Right Back Radiating: sometimes into right hip and leg Onset: More than a month ago Duration: Chronic pain Quality: Discomfort,Constant,Dull Severity: 6 /10 (subjective, self-reported pain score)  Effect on ADL: affects posture when she upright for too long and creates a sense of weakness Timing: Constant Modifying factors: rest or sitting BP: 136/78  HR: 84  Onset and Duration: Present longer than 3 months Cause of pain:  scoliosis Severity: NAS-11 at its worse: 9/10, NAS-11 at its best: 5/10, NAS-11 now: 6/10 and NAS-11 on the average: 7/10 Timing: Afternoon, Not influenced by the time of the day, During activity or exercise and After activity or exercise Aggravating Factors: Bending, Squatting, Stooping , Twisting, Walking and Walking uphill Alleviating Factors: Lying down, Medications, Resting and Sitting Associated Problems: Pain that wakes patient up Quality of Pain: Aching, Annoying, Constant, Disabling, Dull, Nagging and Tender Previous Examinations or Tests: The patient denies treatment Previous Treatments: The patient denies treatment  Cynthia Dawson is a pleasant 75 year old female with a history of thoracolumbar fusion, T10-L4 that was done May 2015 for severe scoliosis and spinal stenosis.  Patient has been receiving her chronic pain management care at [facility] pain.  She states that it is very far for her and that her daughter has to take off of work to take her to her visits.  She is hoping to have her care transferred here which is closer to her home.  She would be able to drive to her appointments here by herself without having her daughter accompany her which would really help her out a lot she states.  I have reviewed clinic notes from her visits at [facility] pain medicine.  Patient's medication regimen is very reasonable and she has been compliant with therapy.  Current Pain Medication Regimen: - Norco 5/325 mg BID (never takes more than 2x a day) - Tramadol 50 mg BID (never takes more than 2x per day) - Gabapentin 37m BID  - Topamax 50 mg BID - Voltaren gel  Her last visit with [facility] pain medicine was 07/09/2020 where she renewed her pain contract and submitted a urine tox screen  for annual medication compliance and monitoring.  There have been no concerns regarding her medication intake or medication history since she has been established with [facility].  I informed Cynthia Dawson that I will be happy to take her on for chronic  opioid therapy here.  Historic Controlled Substance Pharmacotherapy Review  PMP and historical list of controlled substances:  - Norco 5/325 mg BID (never takes more than 2x a day) - Tramadol 50 mg BID (never takes more than 2x per day)  Historical Monitoring: The patient  reports no history of drug use. List of all UDS Test(s): No results found for: MDMA, COCAINSCRNUR, [place], [place], CANNABQUANT, THCU, [place] List of other Serum/Urine Drug Screening Test(s):  No results found for: AMPHSCRSER, BARBSCRSER, BENZOSCRSER, COCAINSCRSER, COCAINSCRNUR, PCPSCRSER, PCPQUANT, THCSCRSER, THCU, CANNABQUANT, OPIATESCRSER, OXYSCRSER, PROPOXSCRSER, ETH Historical Background Evaluation: [place] PMP: PDMP reviewed during this encounter. Online review of the past 35-monthperiod conducted.             [place] Department of public safety, offender search: (Editor, commissioningInformation) Non-contributory Risk Assessment Profile: Aberrant behavior: None observed or detected today Risk factors for fatal opioid overdose: None identified today Fatal overdose hazard ratio (HR): Calculation deferred Non-fatal overdose hazard ratio (HR): Calculation deferred Risk of opioid abuse or dependence: 0.7-3.0% with doses ? 36 MME/day and 6.1-26% with doses ? 120 MME/day. Substance use disorder (SUD) risk level: See below Personal History of Substance Abuse (SUD-Substance use disorder):  Alcohol: Negative  Illegal Drugs: Negative  Rx Drugs: Negative  ORT Risk Level calculation: Low Risk  Opioid Risk Tool - 07/16/20 1313      Family History of Substance Abuse   Alcohol Negative    Illegal Drugs Negative    Rx Drugs Negative      Personal History of Substance Abuse   Alcohol Negative    Illegal Drugs Negative    Rx Drugs Negative      Age   Age between 119-45years  No      Psychological Disease   Psychological Disease Negative    Depression Negative      Total Score   Opioid Risk Tool Scoring 0    Opioid Risk  Interpretation Low Risk          ORT Scoring interpretation table:  Score <3 = Low Risk for SUD  Score between 4-7 = Moderate Risk for SUD  Score >8 = High Risk for Opioid Abuse   PHQ-2 Depression Scale:  Total score:    PHQ-2 Scoring interpretation table: (Score and probability of major depressive disorder)  Score 0 = No depression  Score 1 = 15.4% Probability  Score 2 = 21.1% Probability  Score 3 = 38.4% Probability  Score 4 = 45.5% Probability  Score 5 = 56.4% Probability  Score 6 = 78.6% Probability   PHQ-9 Depression Scale:  Total score:    PHQ-9 Scoring interpretation table:  Score 0-4 = No depression  Score 5-9 = Mild depression  Score 10-14 = Moderate depression  Score 15-19 = Moderately severe depression  Score 20-27 = Severe depression (2.4 times higher risk of SUD and 2.89 times higher risk of overuse)   Pharmacologic Plan: As per protocol, I have not taken over any controlled substance management, pending the results of ordered tests and/or consults.            Initial impression: No immediate contraindications found.  Meds   Current Outpatient Medications:  .  aspirin EC 81 MG tablet, Take 81 mg by mouth  daily., Disp: , Rfl:  .  atorvastatin (LIPITOR) 20 MG tablet, Take 20 mg by mouth daily., Disp: , Rfl:  .  Calcium Carbonate-Vit D-Min (CALCIUM 1200 PO), Take by mouth., Disp: , Rfl:  .  Cholecalciferol (VITAMIN D3) 125 MCG (5000 UT) CAPS, Take by mouth., Disp: , Rfl:  .  diclofenac sodium (VOLTAREN) 1 % GEL, Apply topically as needed., Disp: , Rfl:  .  fenofibrate (TRICOR) 145 MG tablet, Take 145 mg by mouth daily., Disp: , Rfl:  .  gabapentin (NEURONTIN) 300 MG capsule, Take 300 mg by mouth 2 (two) times daily. 2 CAPS 3 X A DAY, Disp: , Rfl:  .  HYDROcodone-acetaminophen (NORCO/VICODIN) 5-325 MG tablet, Take 1 tablet by mouth in the morning and at bedtime., Disp: , Rfl:  .  levothyroxine (SYNTHROID, LEVOTHROID) 75 MCG tablet, Take 75 mcg by mouth daily  before breakfast., Disp: , Rfl:  .  lisinopril (PRINIVIL,ZESTRIL) 2.5 MG tablet, Take 2.5 mg by mouth daily., Disp: , Rfl:  .  metFORMIN (GLUCOPHAGE) 1000 MG tablet, Take 500 mg by mouth daily with breakfast., Disp: , Rfl:  .  METOPROLOL TARTRATE PO, Take 25 mg by mouth daily., Disp: , Rfl:  .  nystatin cream (MYCOSTATIN), Apply 1 application topically as needed for dry skin., Disp: , Rfl:  .  topiramate (TOPAMAX) 50 MG tablet, Take 50 mg by mouth 2 (two) times daily., Disp: , Rfl:  .  traMADol (ULTRAM) 50 MG tablet, Take 50 mg by mouth every 12 (twelve) hours as needed., Disp: , Rfl:    ROS  Cardiovascular: High blood pressure Pulmonary or Respiratory: No reported pulmonary signs or symptoms such as wheezing and difficulty taking a deep full breath (Asthma), difficulty blowing air out (Emphysema), coughing up mucus (Bronchitis), persistent dry cough, or temporary stoppage of breathing during sleep Neurological: Curved spine Psychological-Psychiatric: No reported psychological or psychiatric signs or symptoms such as difficulty sleeping, anxiety, depression, delusions or hallucinations (schizophrenial), mood swings (bipolar disorders) or suicidal ideations or attempts Gastrointestinal: No reported gastrointestinal signs or symptoms such as vomiting or evacuating blood, reflux, heartburn, alternating episodes of diarrhea and constipation, inflamed or scarred liver, or pancreas or irrregular and/or infrequent bowel movements Genitourinary: No reported renal or genitourinary signs or symptoms such as difficulty voiding or producing urine, peeing blood, non-functioning kidney, kidney stones, difficulty emptying the bladder, difficulty controlling the flow of urine, or chronic kidney disease Hematological: No reported hematological signs or symptoms such as prolonged bleeding, low or poor functioning platelets, bruising or bleeding easily, hereditary bleeding problems, low energy levels due to low  hemoglobin or being anemic Endocrine: High blood sugar controlled without the use of insulin (NIDDM) and Slow thyroid Rheumatologic: No reported rheumatological signs and symptoms such as fatigue, joint pain, tenderness, swelling, redness, heat, stiffness, decreased range of motion, with or without associated rash Musculoskeletal: Negative for myasthenia gravis, muscular dystrophy, multiple sclerosis or malignant hyperthermia Work History: Retired  Allergies  Cynthia Dawson is allergic to aspartame and phenylalanine, penicillins, and adhesive [tape].  Laboratory Chemistry Profile   Renal Lab Results  Component Value Date   PROTEINUR 100 mg/dL 09/28/2013     Electrolytes No results found for: NA, K, CL, CALCIUM, MG, PHOS   Hepatic No results found for: AST, ALT, ALBUMIN, ALKPHOS, AMYLASE, LIPASE, AMMONIA   ID Lab Results  Component Value Date   [place] NEGATIVE 02/25/2019     Bone No results found for: Susan Moore, [id number], [id number], [id number], [place], 25OHVITD2, 25OHVITD3, TESTOFREE, TESTOSTERONE  Endocrine Lab Results  Component Value Date   GLUCOSEU Negative 09/28/2013   HGBA1C 5.5 09/06/2013     Neuropathy Lab Results  Component Value Date   HGBA1C 5.5 09/06/2013     CNS No results found for: COLORCSF, APPEARCSF, RBCCOUNTCSF, WBCCSF, POLYSCSF, LYMPHSCSF, EOSCSF, PROTEINCSF, GLUCCSF, JCVIRUS, CSFOLI, IGGCSF, LABACHR, ACETBL, LABACHR, ACETBL   Inflammation (CRP: Acute  ESR: Chronic) No results found for: CRP, ESRSEDRATE, LATICACIDVEN   Rheumatology No results found for: RF, ANA, LABURIC, URICUR, LYMEIGGIGMAB, LYMEABIGMQN, HLAB27   Coagulation Lab Results  Component Value Date   PLT 446 (H) 09/08/2013     Cardiovascular Lab Results  Component Value Date   HGB 8.9 (L) 09/08/2013   HCT 27.2 (L) 09/08/2013     Screening Lab Results  Component Value Date   SARSCOV2NAA NEGATIVE 02/25/2019     Cancer No results found for: CEA, CA125, LABCA2    Allergens No results found for: ALMOND, APPLE, ASPARAGUS, AVOCADO, BANANA, BARLEY, BASIL, B[place]LE[place], GREENBEAN, LIMABEAN, WHITEBEAN, BEEFIGE, REDBEET, BLUEBERRY, BROCCOLI, CABBAGE, MELON, CARROT, CASEIN, CASHEWNUT, CAULIFLOWER, CELERY     Note: Lab results reviewed.  PFSH  Drug: Cynthia Dawson  reports no history of drug use. Alcohol:  reports no history of alcohol use. Tobacco:  reports that she has never smoked. She has never used smokeless tobacco. Medical:  has a past medical history of Arthritis, Diabetes mellitus without complication ([place]), Heart murmur, Hypercholesteremia, Hypertension, Hypothyroidism, and Neuropathy. Family: family history includes Breast cancer in her paternal aunt.  Past Surgical History:  Procedure Laterality Date  . ABDOMINAL HYSTERECTOMY    . BACK SURGERY     RODS  . CATARACT EXTRACTION W/PHACO Right 07/03/2015   Procedure: CATARACT EXTRACTION PHACO AND INTRAOCULAR LENS PLACEMENT (IOC);  Surgeon: Birder Robson, MD;  Location: [facility] ORS;  Service: Ophthalmology;  Laterality: Right;  [place] 01:12 AP% 22.4 CDE 16.28 fluid pack lot # [id number]  . CATARACT EXTRACTION W/PHACO Left 03/01/2019   Procedure: CATARACT EXTRACTION PHACO AND INTRAOCULAR LENS PLACEMENT (IOC) LEFT DIABETIC 00:58.8          17.7%           10.40;  Surgeon: Birder Robson, MD;  Location: [place];  Service: Ophthalmology;  Laterality: Left;  Diabetic - oral meds  . CESAREAN SECTION    . CHOLECYSTECTOMY    . COLONOSCOPY WITH PROPOFOL N/A 06/06/2016   Procedure: COLONOSCOPY WITH PROPOFOL;  Surgeon: Manya Silvas, MD;  Location[facility] END[facility]COPY;  Service: Endoscopy;  Laterality: N/A;  . EYE SURGERY     Active Ambulatory Problems    Diagnosis Date Noted  . Chronic back pain 07/16/2020  . Chronic pain syndrome 08/09/2012  . CKD (chronic kidney disease) stage 3, GFR 30-59 ml/min (HCC) 08/28/2016  . Lumbosacral spondylosis without myelopathy 08/09/2012  . Type 2 diabetes mellitus with  kidney complication, without long-term current use of insulin ([place]) 07/16/2020  . Postlaminectomy syndrome of lumbosacral region 02/19/2015  . Status post lumbar spinal fusion (T10-L4) 07/16/2020  . Status post thoracic spinal fusion (T10-L4) 07/16/2020   Resolved Ambulatory Problems    Diagnosis Date Noted  . No Resolved Ambulatory Problems   Past Medical History:  Diagnosis Date  . Arthritis   . Diabetes mellitus without complication ([place])   . Heart murmur   . Hypercholesteremia   . Hypertension   . Hypothyroidism   . Neuropathy    Constitutional Exam  General appearance: Well nourished, well developed, and well hydrated. In no apparent acute distress Vitals:  07/16/20 1301  BP: 136/78  Pulse: 84  Resp: 16  Temp: (!) 97.4 F (36.3 C)  TempSrc: Temporal  SpO2: 98%  Weight: 183 lb (83 kg)  Height: '4\' 11"'  (1.499 m)   BMI Assessment: Estimated body mass index is 36.96 kg/m as calculated from the following:   Height as of this encounter: '4\' 11"'  (1.499 m).   Weight as of this encounter: 183 lb (83 kg).  BMI interpretation table: BMI level Category Range association with higher incidence of chronic pain  <18 kg/m2 Underweight   18.5-24.9 kg/m2 Ideal body weight   25-29.9 kg/m2 Overweight Increased incidence by 20%  30-34.9 kg/m2 Obese (Class I) Increased incidence by 68%  35-39.9 kg/m2 Severe obesity (Class II) Increased incidence by 136%  >40 kg/m2 Extreme obesity (Class III) Increased incidence by 254%   Patient's current BMI Ideal Body weight  Body mass index is 36.96 kg/m. Patient must be at least 60 in tall to calculate ideal body weight   BMI Readings from Last 4 Encounters:  07/16/20 36.96 kg/m  03/01/19 38.46 kg/m  06/06/16 41.12 kg/m  07/03/15 43.26 kg/m   Wt Readings from Last 4 Encounters:  07/16/20 183 lb (83 kg)  03/01/19 184 lb (83.5 kg)  06/06/16 190 lb (86.2 kg)  07/03/15 207 lb (93.9 kg)    Psych/Mental status: Alert, oriented x 3  (person, place, & time)       Eyes: PERLA Respiratory: No evidence of acute respiratory distress  Cervical Spine Exam  Skin & Axial Inspection: No masses, redness, edema, swelling, or associated skin lesions Alignment: Symmetrical Functional ROM: Unrestricted ROM      Stability: No instability detected Muscle Tone/Strength: Functionally intact. No obvious neuro-muscular anomalies detected. Sensory (Neurological): Unimpaired Palpation: No palpable anomalies               Thoracic Spine Area Exam  Skin & Axial Inspection: Well healed scar from previous spine surgery detected Alignment: Asymmetric Functional ROM: Mechanically restricted ROM Stability: No instability detected Muscle Tone/Strength: Functionally intact. No obvious neuro-muscular anomalies detected. Sensory (Neurological): Dermatomal pain pattern  Lumbar Exam  Skin & Axial Inspection: Well healed scar from previous spine surgery detected Alignment: Asymmetric Functional ROM: Mechanically restricted ROM       Stability: No instability detected Muscle Tone/Strength: Functionally intact. No obvious neuro-muscular anomalies detected. Sensory (Neurological): Dermatomal pain pattern right hip/leg  Gait & Posture Assessment  Ambulation: Unassisted Gait: Antalgic Posture: Difficulty standing up straight, due to pain   Lower Extremity Exam    Side: Right lower extremity  Side: Left lower extremity  Stability: No instability observed          Stability: No instability observed          Skin & Extremity Inspection: Skin color, temperature, and hair growth are WNL. No peripheral edema or cyanosis. No masses, redness, swelling, asymmetry, or associated skin lesions. No contractures.  Skin & Extremity Inspection: Skin color, temperature, and hair growth are WNL. No peripheral edema or cyanosis. No masses, redness, swelling, asymmetry, or associated skin lesions. No contractures.  Functional ROM: Pain restricted ROM for hip and knee  joints          Functional ROM: Unrestricted ROM                  Muscle Tone/Strength: Functionally intact. No obvious neuro-muscular anomalies detected.  Muscle Tone/Strength: Functionally intact. No obvious neuro-muscular anomalies detected.  Sensory (Neurological): Dermatomal pain pattern medial portion of foot (L4)  Sensory (Neurological): Unimpaired        DTR: Patellar: deferred today Achilles: deferred today Plantar: deferred today  DTR: Patellar: deferred today Achilles: deferred today Plantar: deferred today  Palpation: No palpable anomalies  Palpation: No palpable anomalies   Assessment  Primary Diagnosis & Pertinent Problem List: The primary encounter diagnosis was Chronic pain syndrome. Diagnoses of Lumbosacral spondylosis without myelopathy, Status post thoracic spinal fusion (T10-L4), Status post lumbar spinal fusion (T10-L4), and Postlaminectomy syndrome of lumbosacral region were also pertinent to this visit.  Visit Diagnosis (New problems to examiner): 1. Chronic pain syndrome   2. Lumbosacral spondylosis without myelopathy   3. Status post thoracic spinal fusion (T10-L4)   4. Status post lumbar spinal fusion (T10-L4)   5. Postlaminectomy syndrome of lumbosacral region    Plan of Care (Initial workup plan)  Note: Cynthia Dawson was reminded that as per protocol, today's visit has been an evaluation only. We have not taken over the patient's controlled substance management.  General Recommendations: The pain condition that the patient suffers from is best treated with a multidisciplinary approach that involves an increase in physical activity to prevent de-conditioning and worsening of the pain cycle, as well as psychological counseling (formal and/or informal) to address the co-morbid psychological affects of pain. Treatment will often involve judicious use of pain medications and interventional procedures to decrease the pain, allowing the patient to participate in the  physical activity that will ultimately produce long-lasting pain reductions. The goal of the multidisciplinary approach is to return the patient to a higher level of overall function and to restore their ability to perform activities of daily living.   Lab Orders     Compliance Drug Analysis, Ur  Pharmacological management options:  Opioid Analgesics: The patient was informed that there is no guarantee that she would be a candidate for opioid analgesics. The decision will be made following CDC guidelines. This decision will be based on the results of diagnostic studies, as well as Cynthia Dawson's risk profile.   Membrane stabilizer: Adequate regimen Gabapentin 300 mg BID  Muscle relaxant: To be determined at a later time  NSAID: To be determined at a later time  Other analgesic(s): Topamax 50 mg BID    Provider-requested follow-up: Return in about 3 months (around 10/16/2020) for Medication Management, in person.  Future Appointments  Date Time Provider [place]  10/11/2020 12:40 PM Gillis Santa, MD [facility] None    Note by: Gillis Santa, MD Date: 07/16/2020; Time: 1:52 PM

## 2020-07-21 LAB — COMPLIANCE DRUG ANALYSIS, UR

## 2020-10-11 ENCOUNTER — Ambulatory Visit
Payer: Medicare HMO | Attending: Student in an Organized Health Care Education/Training Program | Admitting: Student in an Organized Health Care Education/Training Program

## 2020-10-11 ENCOUNTER — Other Ambulatory Visit: Payer: Self-pay

## 2020-10-11 ENCOUNTER — Encounter: Payer: Self-pay | Admitting: Student in an Organized Health Care Education/Training Program

## 2020-10-11 VITALS — BP 144/54 | HR 79 | Temp 97.0°F | Resp 16 | Ht <= 58 in | Wt 183.0 lb

## 2020-10-11 DIAGNOSIS — Z981 Arthrodesis status: Secondary | ICD-10-CM | POA: Diagnosis not present

## 2020-10-11 DIAGNOSIS — Z0289 Encounter for other administrative examinations: Secondary | ICD-10-CM | POA: Diagnosis not present

## 2020-10-11 DIAGNOSIS — M961 Postlaminectomy syndrome, not elsewhere classified: Secondary | ICD-10-CM

## 2020-10-11 DIAGNOSIS — G894 Chronic pain syndrome: Secondary | ICD-10-CM | POA: Diagnosis not present

## 2020-10-11 DIAGNOSIS — M47817 Spondylosis without myelopathy or radiculopathy, lumbosacral region: Secondary | ICD-10-CM | POA: Diagnosis not present

## 2020-10-11 MED ORDER — TOPIRAMATE 50 MG PO TABS: 50.0000 mg | ORAL_TABLET | Freq: Two times a day (BID) | ORAL | 5 refills | Status: DC

## 2020-10-11 MED ORDER — HYDROCODONE-ACETAMINOPHEN 5-325 MG PO TABS: 1.0000 | ORAL_TABLET | Freq: Every day | ORAL | 0 refills | Status: DC | PRN

## 2020-10-11 MED ORDER — TRAMADOL HCL 50 MG PO TABS: 50.0000 mg | ORAL_TABLET | Freq: Two times a day (BID) | ORAL | 1 refills | Status: DC | PRN

## 2020-10-11 MED ORDER — GABAPENTIN 300 MG PO CAPS: 300.0000 mg | ORAL_CAPSULE | Freq: Two times a day (BID) | ORAL | 5 refills | Status: DC

## 2020-10-11 MED ORDER — HYDROCODONE-ACETAMINOPHEN 5-325 MG PO TABS: 1.0000 | ORAL_TABLET | Freq: Every day | ORAL | 0 refills | Status: AC | PRN

## 2020-10-11 NOTE — Progress Notes (Signed)
PROVIDER NOTE: Information contained herein reflects review and annotations entered in association with encounter. Interpretation of such information and data should be left to medically-trained personnel. Information provided to patient can be located elsewhere in the medical record under "Patient Instructions". Document created using STT-dictation technology, any transcriptional errors that may result from process are unintentional.    Patient: Cynthia Dawson  Service Category: E/M  Provider: Gillis Santa, MD  DOB: [birth year]  DOS: 10/11/2020  Specialty: Interventional Pain Management  MRN: [id number]  Setting: Ambulatory outpatient  PCP: Dion Body, MD  Type: Established Patient    Referring Provider: Dion Body, MD  Location: Office  Delivery: Face-to-face     Primary Reason(s) for Visit: Encounter for evaluation before starting new chronic pain management plan of care (Level of risk: moderate) CC: Back Pain (lower)  HPI  Cynthia Dawson is a 75 y.o. year old, female patient, who comes today for a follow-up evaluation to review the test results and decide on a treatment plan. She has Chronic back pain; Chronic pain syndrome; CKD (chronic kidney disease) stage 3, GFR 30-59 ml/min (HCC); Lumbosacral spondylosis without myelopathy; Type 2 diabetes mellitus with kidney complication, without long-term current use of insulin ([place]); Postlaminectomy syndrome of lumbosacral region; Status post lumbar spinal fusion (T10-L4); Status post thoracic spinal fusion (T10-L4); and Pain management contract signed on their problem list. Her primarily concern today is the Back Pain (lower)  Pain Assessment: Location: Lower Back Radiating: right leg to the foot Onset: More than a month ago Duration: Chronic pain Quality: Dull Severity: 6 /10 (subjective, self-reported pain score)  Effect on ADL:   Timing: Intermittent Modifying factors: lying down with leg elevated, medications BP: (!) 144/54  HR:  79  Cynthia Dawson comes in today for a follow-up visit after her initial evaluation on 07/16/2020.  See HPI below from that visit:  Cynthia Dawson is a pleasant 75 year old female with a history of thoracolumbar fusion, T10-L4 that was done May 2015 for severe scoliosis and spinal stenosis.  Patient has been receiving her chronic pain management care at [facility].  She states that it is very far for her and that her daughter has to take off of work to take her to her visits.  She is hoping to have her care transferred here which is closer to her home.  She would be able to drive to her appointments here by herself without having her daughter accompany her which would really help her out a lot she states.  I have reviewed clinic notes from her visits at [facility] pain medicine.  Patient's medication regimen is very reasonable and she has been compliant with therapy.   Current Pain Medication Regimen: - Norco 5/325 mg BID (never takes more than 2x a day) - Tramadol 50 mg BID (never takes more than 2x per day) - Gabapentin 355m BID  - Topamax 50 mg BID - Voltaren gel   Her last visit with [facility] pain medicine was 07/09/2020 where she renewed her pain contract and submitted a urine tox screen for annual medication compliance and monitoring.  There have been no concerns regarding her medication intake or medication history since [facility]   I informed Cynthia Dawson I will be happy to take her on for chronic opioid therapy here.  10/11/2020: No change in her medical history.  UDS up-to-date and appropriate.  I will have the patient sign pain contract and take over her chronic pain regimen as above.  No change  in dose.  Patient was very appreciative that she is able to establish year as this is much closer to her home.   Controlled Substance Pharmacotherapy Assessment REMS (Risk Evaluation and Mitigation Strategy)   Pill Count: None expected due to no prior prescriptions written by our  practice. Cynthia Martins, RN  10/11/2020 12:15 PM  Sign when Signing Visit Nursing Pain Medication Assessment:  Safety precautions to be maintained throughout the outpatient stay will include: orient to surroundings, keep bed in low position, maintain call bell within reach at all times, provide assistance with transfer out of bed and ambulation.  Medication Inspection Compliance: Pill count conducted under aseptic conditions, in front of the patient. Neither the pills nor the bottle was removed from the patient's sight at any time. Once count was completed pills were immediately returned to the patient in their original bottle.  Medication #1: Hydrocodone/APAP Pill/Patch Count:  19 of 45 pills remain Pill/Patch Appearance: Markings consistent with prescribed medication Bottle Appearance: Standard pharmacy container. Clearly labeled. Filled Date: 06 / 06 / 2022 Last Medication intake:  Yesterday  Medication #2: Tramadol (Ultram) Pill/Patch Count:  38 of 60 pills remain Pill/Patch Appearance: Markings consistent with prescribed medication Bottle Appearance: Standard pharmacy container. Clearly labeled. Filled Date: 05 / 27 / 2022 Last Medication intake:  Today  Pharmacokinetics: Liberation and absorption (onset of action): WNL Distribution (time to peak effect): WNL Metabolism and excretion (duration of action): WNL         Pharmacodynamics: Desired effects: Analgesia: Cynthia Dawson reports >50% benefit. Functional ability: Patient reports that medication allows her to accomplish basic ADLs Clinically meaningful improvement in function (CMIF): Sustained CMIF goals met Perceived effectiveness: Described as relatively effective, allowing for increase in activities of daily living (ADL) Undesirable effects: Side-effects or Adverse reactions: None reported Monitoring: [place] PMP: PDMP not reviewed this encounter. Online review of the past 79-monthperiod previously conducted. Not applicable at  this point since we have not taken over the patient's medication management yet. List of other Serum/Urine Drug Screening Test(s):  No results found for: AMPHSCRSER, BARBSCRSER, BENZOSCRSER, COCAINSCRSER, COCAINSCRNUR, PCPSCRSER, THCSCRSER, THCU, CANNABQUANT, OBlain [place] ELattaList of all UDS test(s) done:  Lab Results  Component Value Date   SUMMARY Note 07/16/2020   Last UDS on record: Summary  Date Value Ref Range Status  07/16/2020 Note  Final    Comment:    ==================================================================== Compliance Drug Analysis, Ur ==================================================================== Test                             Result       Flag       Units  Drug Present and Declared for Prescription Verification   Norhydrocodone                 163          EXPECTED   ng/mg creat    Norhydrocodone is an expected metabolite of hydrocodone.    Tramadol                       6948         EXPECTED   ng/mg creat   O-Desmethyltramadol            >7463        EXPECTED   ng/mg creat   N-Desmethyltramadol            618  EXPECTED   ng/mg creat    Source of tramadol is a prescription medication. O-desmethyltramadol    and N-desmethyltramadol are expected metabolites of tramadol.    Gabapentin                     PRESENT      EXPECTED   Topiramate                     PRESENT      EXPECTED   Acetaminophen                  PRESENT      EXPECTED   Metoprolol                     PRESENT      EXPECTED  Drug Present not Declared for Prescription Verification   Ephedrine/Pseudoephedrine      PRESENT      UNEXPECTED   Phenylpropanolamine            PRESENT      UNEXPECTED    Source of ephedrine/pseudoephedrine is most commonly pseudoephedrine    in over-the-counter or prescription cold and allergy medications.    Phenylpropanolamine is an expected metabolite of    ephedrine/pseudoephedrine.  Drug Absent but Declared for  Prescription Verification   Hydrocodone                    Not Detected UNEXPECTED ng/mg creat    Hydrocodone is almost always present in patients taking this drug    consistently. Absence of hydrocodone could be due to lapse of time    since the last dose or unusual pharmacokinetics (rapid metabolism).    Diclofenac                     Not Detected UNEXPECTED    Topical diclofenac, as indicated in the declared medication list, is    not always detected even when used as directed.    Salicylate                     Not Detected UNEXPECTED    Aspirin, as indicated in the declared medication list, is not always    detected even when used as directed.  ==================================================================== Test                      Result    Flag   Units      Ref Range   Creatinine              67               mg/dL      >=20 ==================================================================== Declared Medications:  The flagging and interpretation on this report are based on the  following declared medications.  Unexpected results may arise from  inaccuracies in the declared medications.   **Note: The testing scope of this panel includes these medications:   Gabapentin (Neurontin)  Hydrocodone (Norco)  Metoprolol  Topiramate (Topamax)  Tramadol (Ultram)   **Note: The testing scope of this panel does not include small to  moderate amounts of these reported medications:   Acetaminophen (Norco)  Aspirin  Topical Diclofenac (Voltaren)   **Note: The testing scope of this panel does not include the  following reported medications:   Atorvastatin (Lipitor)  Calcium  Fenofibrate (TriCor)  Levothyroxine (Synthroid)  Lisinopril (Zestril)  Metformin (Glucophage)  Nystatin (Mycostatin)  Vitamin D3 ==================================================================== For clinical consultation, please call [phone]. ====================================================================    UDS interpretation: No unexpected findings.          Medication Assessment Form: Patient introduced to form today Treatment compliance: Treatment may start today if patient agrees with proposed plan. Evaluation of compliance is not applicable at this point Risk Assessment Profile: Aberrant behavior: See initial evaluations. None observed or detected today Comorbid factors increasing risk of overdose: See initial evaluation. No additional risks detected today Opioid risk tool (ORT):  Opioid Risk  07/16/2020  Alcohol 0  Illegal Drugs 0  Rx Drugs 0  Alcohol 0  Illegal Drugs 0  Rx Drugs 0  Age between 16-45 years  0  Psychological Disease 0  Depression 0  Opioid Risk Tool Scoring 0  Opioid Risk Interpretation Low Risk    ORT Scoring interpretation table:  Score <3 = Low Risk for SUD  Score between 4-7 = Moderate Risk for SUD  Score >8 = High Risk for Opioid Abuse   Risk of substance use disorder (SUD): Low  Risk Mitigation Strategies:  Patient opioid safety counseling: Completed today. Counseling provided to patient as per "Patient Counseling Document". Document signed by patient, attesting to counseling and understanding Patient-Prescriber Agreement (PPA): Obtained today.  Controlled substance notification to other providers: Written and sent today.  Pharmacologic Plan: Today we may be taking over the patient's pharmacological regimen. See below.             Laboratory Chemistry Profile   Renal Lab Results  Component Value Date   PROTEINUR 100 mg/dL 09/28/2013     Electrolytes No results found for: NA, K, CL, CALCIUM, MG, PHOS   Hepatic No results found for: AST, ALT, ALBUMIN, ALKPHOS, AMYLASE, LIPASE, AMMONIA   ID Lab Results  Component Value Date   SARSCOV2NAA NEGATIVE 02/25/2019     Bone No results found for: VD25OH, [id number], FV4944HQ7, RF1638GY6, 25OHVITD1, 25OHVITD2,  25OHVITD3, TESTOFREE, TESTOSTERONE   Endocrine Lab Results  Component Value Date   GLUCOSEU Negative 09/28/2013   HGBA1C 5.5 09/06/2013     Neuropathy Lab Results  Component Value Date   HGBA1C 5.5 09/06/2013     CNS No results found for: COLORCSF, APPEARCSF, RBCCOUNTCSF, WBCCSF, POLYSCSF, LYMPHSCSF, EOSCSF, PROTEINCSF, GLUCCSF, JCVIRUS, CSFOLI, IGGCSF, LABACHR, ACETBL, LABACHR, ACETBL   Inflammation (CRP: Acute  ESR: Chronic) No results found for: CRP, ESRSEDRATE, LATICACIDVEN   Rheumatology No results found for: RF, ANA, LABURIC, URICUR, LYMEIGGIGMAB, LYMEABIGMQN, HLAB27   Coagulation Lab Results  Component Value Date   PLT 446 (H) 09/08/2013     Cardiovascular Lab Results  Component Value Date   HGB 8.9 (L) 09/08/2013   HCT 27.2 (L) 09/08/2013     Screening Lab Results  Component Value Date   SARSCOV2NAA NEGATIVE 02/25/2019     Cancer No results found for: CEA, CA125, LABCA2   Allergens No results found for: ALMOND, APPLE, ASPARAGUS, AVOCADO, BANANA, BARLEY, BASIL, BAYLEAF, GREENBEAN, LIMABEAN, WHITEBEAN, BEEFIGE, REDBEET, BLUEBERRY, BROCCOLI, CABBAGE, MELON, CARROT, CASEIN, CASHEWNUT, CAULIFLOWER, CELERY     Note: Lab results reviewed.   Meds   Current Outpatient Medications:    aspirin EC 81 MG tablet, Take 81 mg by mouth daily., Disp: , Rfl:    atorvastatin (LIPITOR) 20 MG tablet, Take 20 mg by mouth daily., Disp: , Rfl:    Calcium Carbonate-Vit D-Min (CALCIUM 1200 PO), Take by mouth., Disp: , Rfl:    Cholecalciferol (VITAMIN D3) 125 MCG (5000 UT) CAPS, Take  by mouth., Disp: , Rfl:    diclofenac sodium (VOLTAREN) 1 % GEL, Apply topically as needed., Disp: , Rfl:    fenofibrate (TRICOR) 145 MG tablet, Take 145 mg by mouth daily., Disp: , Rfl:    levothyroxine (SYNTHROID, LEVOTHROID) 75 MCG tablet, Take 75 mcg by mouth daily before breakfast., Disp: , Rfl:    lisinopril (PRINIVIL,ZESTRIL) 2.5 MG tablet, Take 2.5 mg by mouth daily., Disp: , Rfl:     metFORMIN (GLUCOPHAGE) 1000 MG tablet, Take 500 mg by mouth daily with breakfast., Disp: , Rfl:    METOPROLOL TARTRATE PO, Take 25 mg by mouth daily., Disp: , Rfl:    nystatin cream (MYCOSTATIN), Apply 1 application topically as needed for dry skin., Disp: , Rfl:    gabapentin (NEURONTIN) 300 MG capsule, Take 1 capsule (300 mg total) by mouth 2 (two) times daily., Disp: 60 capsule, Rfl: 5   [START ON 10/17/2020] HYDROcodone-acetaminophen (NORCO/VICODIN) 5-325 MG tablet, Take 1-2 tablets by mouth daily as needed for moderate pain. For chronic pain syndrome, Disp: 45 tablet, Rfl: 0   [START ON 11/16/2020] HYDROcodone-acetaminophen (NORCO/VICODIN) 5-325 MG tablet, Take 1-2 tablets by mouth daily as needed for moderate pain. For chronic pain syndrome, Disp: 45 tablet, Rfl: 0   topiramate (TOPAMAX) 50 MG tablet, Take 1 tablet (50 mg total) by mouth 2 (two) times daily., Disp: 60 tablet, Rfl: 5   [START ON 10/15/2020] traMADol (ULTRAM) 50 MG tablet, Take 1 tablet (50 mg total) by mouth every 12 (twelve) hours as needed. For chronic pain, Disp: 60 tablet, Rfl: 1  ROS  Constitutional: Denies any fever or chills Gastrointestinal: No reported hemesis, hematochezia, vomiting, or acute GI distress Musculoskeletal: Denies any acute onset joint swelling, redness, loss of ROM, or weakness Neurological: No reported episodes of acute onset apraxia, aphasia, dysarthria, agnosia, amnesia, paralysis, loss of coordination, or loss of consciousness  Allergies  Cynthia Dawson is allergic to aspartame and phenylalanine, penicillins, and adhesive [tape].  PFSH  Drug: Cynthia Dawson  reports no history of drug use. Alcohol:  reports no history of alcohol use. Tobacco:  reports that she has never smoked. She has never used smokeless tobacco. Medical:  has a past medical history of Arthritis, Diabetes mellitus without complication ([place]), Heart murmur, Hypercholesteremia, Hypertension, Hypothyroidism, and Neuropathy. Surgical: Ms.  Dawson  has a past surgical history that includes Back surgery; Cesarean section; Cholecystectomy; Abdominal hysterectomy; Cataract extraction w/PHACO (Right, 07/03/2015); Eye surgery; Colonoscopy with propofol (N/A, 06/06/2016); and Cataract extraction w/PHACO (Left, 03/01/2019). Family: family history includes Breast cancer in her paternal aunt.  Constitutional Exam  General appearance: Well nourished, well developed, and well hydrated. In no apparent acute distress Vitals:   10/11/20 1201  BP: (!) 144/54  Pulse: 79  Resp: 16  Temp: (!) 97 F (36.1 C)  TempSrc: Temporal  SpO2: 97%  Weight: 183 lb (83 kg)  Height: '4\' 10"'  (1.473 m)   BMI Assessment: Estimated body mass index is 38.25 kg/m as calculated from the following:   Height as of this encounter: '4\' 10"'  (1.473 m).   Weight as of this encounter: 183 lb (83 kg).  BMI interpretation table: BMI level Category Range association with higher incidence of chronic pain  <18 kg/m2 Underweight   18.5-24.9 kg/m2 Ideal body weight   25-29.9 kg/m2 Overweight Increased incidence by 20%  30-34.9 kg/m2 Obese (Class I) Increased incidence by 68%  35-39.9 kg/m2 Severe obesity (Class II) Increased incidence by 136%  >40 kg/m2 Extreme obesity (Class III) Increased incidence  by 254%   Patient's current BMI Ideal Body weight  Body mass index is 38.25 kg/m. Patient must be at least 60 in tall to calculate ideal body weight   BMI Readings from Last 4 Encounters:  10/11/20 38.25 kg/m  07/16/20 36.96 kg/m  03/01/19 38.46 kg/m  06/06/16 41.12 kg/m   Wt Readings from Last 4 Encounters:  10/11/20 183 lb (83 kg)  07/16/20 183 lb (83 kg)  03/01/19 184 lb (83.5 kg)  06/06/16 190 lb (86.2 kg)    Psych/Mental status: Alert, oriented x 3 (person, place, & time)       Eyes: PERLA Respiratory: No evidence of acute respiratory distress  Thoracic Spine Area Exam  Skin & Axial Inspection: Well healed scar from previous spine surgery  detected Alignment: Asymmetric Functional ROM: Mechanically restricted ROM Stability: No instability detected Muscle Tone/Strength: Functionally intact. No obvious neuro-muscular anomalies detected. Sensory (Neurological): Dermatomal pain pattern   Lumbar Exam  Skin & Axial Inspection: Well healed scar from previous spine surgery detected Alignment: Asymmetric Functional ROM: Mechanically restricted ROM       Stability: No instability detected Muscle Tone/Strength: Functionally intact. No obvious neuro-muscular anomalies detected. Sensory (Neurological): Dermatomal pain pattern right hip/leg   Gait & Posture Assessment  Ambulation: Unassisted Gait: Antalgic Posture: Difficulty standing up straight, due to pain    Lower Extremity Exam      Side: Right lower extremity   Side: Left lower extremity  Stability: No instability observed           Stability: No instability observed          Skin & Extremity Inspection: Skin color, temperature, and hair growth are WNL. No peripheral edema or cyanosis. No masses, redness, swelling, asymmetry, or associated skin lesions. No contractures.   Skin & Extremity Inspection: Skin color, temperature, and hair growth are WNL. No peripheral edema or cyanosis. No masses, redness, swelling, asymmetry, or associated skin lesions. No contractures.  Functional ROM: Pain restricted ROM for hip and knee joints           Functional ROM: Unrestricted ROM                  Muscle Tone/Strength: Functionally intact. No obvious neuro-muscular anomalies detected.   Muscle Tone/Strength: Functionally intact. No obvious neuro-muscular anomalies detected.  Sensory (Neurological): Dermatomal pain pattern medial portion of foot (L4)   Sensory (Neurological): Unimpaired        DTR: Patellar: deferred today Achilles: deferred today Plantar: deferred today   DTR: Patellar: deferred today Achilles: deferred today Plantar: deferred today  Palpation: No palpable anomalies    Palpation: No palpable anomalies    Assessment & Plan  Primary Diagnosis & Pertinent Problem List: The primary encounter diagnosis was Chronic pain syndrome. Diagnoses of Pain management contract signed, Lumbosacral spondylosis without myelopathy, Status post thoracic spinal fusion (T10-L4), Status post lumbar spinal fusion (T10-L4), and Postlaminectomy syndrome of lumbosacral region were also pertinent to this visit.  Visit Diagnosis: 1. Chronic pain syndrome   2. Pain management contract signed   3. Lumbosacral spondylosis without myelopathy   4. Status post thoracic spinal fusion (T10-L4)   5. Status post lumbar spinal fusion (T10-L4)   6. Postlaminectomy syndrome of lumbosacral region    Problems updated and reviewed during this visit: Problem  Pain Management Contract Signed    Plan of Care  Pharmacotherapy (Medications Ordered): Meds ordered this encounter  Medications   HYDROcodone-acetaminophen (NORCO/VICODIN) 5-325 MG tablet    Sig: Take 1-2  tablets by mouth daily as needed for moderate pain. For chronic pain syndrome    Dispense:  45 tablet    Refill:  0   HYDROcodone-acetaminophen (NORCO/VICODIN) 5-325 MG tablet    Sig: Take 1-2 tablets by mouth daily as needed for moderate pain. For chronic pain syndrome    Dispense:  45 tablet    Refill:  0   traMADol (ULTRAM) 50 MG tablet    Sig: Take 1 tablet (50 mg total) by mouth every 12 (twelve) hours as needed. For chronic pain    Dispense:  60 tablet    Refill:  1   gabapentin (NEURONTIN) 300 MG capsule    Sig: Take 1 capsule (300 mg total) by mouth 2 (two) times daily.    Dispense:  60 capsule    Refill:  5   topiramate (TOPAMAX) 50 MG tablet    Sig: Take 1 tablet (50 mg total) by mouth 2 (two) times daily.    Dispense:  60 tablet    Refill:  5      Provider-requested follow-up: Return in about 8 weeks (around 12/06/2020) for Medication Management, in person. Recent Visits Date Type Provider Dept  07/16/20 Office  Visit Gillis Santa, MD [facility]  Showing recent visits within past 90 days and meeting all other requirements Today's Visits Date Type Provider Dept  10/11/20 Office Visit Gillis Santa, MD [facility]  Showing today's visits and meeting all other requirements Future Appointments Date Type Provider Dept  12/04/20 Appointment Gillis Santa, MD [facility]  Showing future appointments within next 90 days and meeting all other requirements Primary Care Physician: Dion Body, MD Note by: Gillis Santa, MD Date: 10/11/2020; Time: 1:48 PM

## 2020-10-11 NOTE — Patient Instructions (Signed)
Please sign pain contract

## 2020-10-11 NOTE — Progress Notes (Signed)
Nursing Pain Medication Assessment:  Safety precautions to be maintained throughout the outpatient stay will include: orient to surroundings, keep bed in low position, maintain call bell within reach at all times, provide assistance with transfer out of bed and ambulation.  Medication Inspection Compliance: Pill count conducted under aseptic conditions, in front of the patient. Neither the pills nor the bottle was removed from the patient's sight at any time. Once count was completed pills were immediately returned to the patient in their original bottle.  Medication #1: Hydrocodone/APAP Pill/Patch Count:  19 of 45 pills remain Pill/Patch Appearance: Markings consistent with prescribed medication Bottle Appearance: Standard pharmacy container. Clearly labeled. Filled Date: 06 / 06 / 2022 Last Medication intake:  Yesterday  Medication #2: Tramadol (Ultram) Pill/Patch Count:  38 of 60 pills remain Pill/Patch Appearance: Markings consistent with prescribed medication Bottle Appearance: Standard pharmacy container. Clearly labeled. Filled Date: 05 / 27 / 2022 Last Medication intake:  Today

## 2020-12-04 ENCOUNTER — Other Ambulatory Visit: Payer: Self-pay

## 2020-12-04 ENCOUNTER — Ambulatory Visit
Payer: Medicare HMO | Attending: Student in an Organized Health Care Education/Training Program | Admitting: Student in an Organized Health Care Education/Training Program

## 2020-12-04 ENCOUNTER — Encounter: Payer: Self-pay | Admitting: Student in an Organized Health Care Education/Training Program

## 2020-12-04 VITALS — BP 149/66 | HR 74 | Temp 96.7°F | Resp 16 | Ht <= 58 in | Wt 183.0 lb

## 2020-12-04 DIAGNOSIS — M47817 Spondylosis without myelopathy or radiculopathy, lumbosacral region: Secondary | ICD-10-CM | POA: Insufficient documentation

## 2020-12-04 DIAGNOSIS — M961 Postlaminectomy syndrome, not elsewhere classified: Secondary | ICD-10-CM | POA: Diagnosis not present

## 2020-12-04 DIAGNOSIS — Z981 Arthrodesis status: Secondary | ICD-10-CM | POA: Insufficient documentation

## 2020-12-04 DIAGNOSIS — G894 Chronic pain syndrome: Secondary | ICD-10-CM | POA: Insufficient documentation

## 2020-12-04 MED ORDER — HYDROCODONE-ACETAMINOPHEN 5-325 MG PO TABS: 1.0000 | ORAL_TABLET | Freq: Every day | ORAL | 0 refills | Status: AC | PRN

## 2020-12-04 MED ORDER — TRAMADOL HCL 50 MG PO TABS: 50.0000 mg | ORAL_TABLET | Freq: Two times a day (BID) | ORAL | 1 refills | Status: DC | PRN

## 2020-12-04 MED ORDER — HYDROCODONE-ACETAMINOPHEN 5-325 MG PO TABS: 1.0000 | ORAL_TABLET | Freq: Every day | ORAL | 0 refills | Status: DC | PRN

## 2020-12-04 NOTE — Progress Notes (Signed)
Nursing Pain Medication Assessment:  Safety precautions to be maintained throughout the outpatient stay will include: orient to surroundings, keep bed in low position, maintain call bell within reach at all times, provide assistance with transfer out of bed and ambulation.  Medication Inspection Compliance: Pill count conducted under aseptic conditions, in front of the patient. Neither the pills nor the bottle was removed from the patient's sight at any time. Once count was completed pills were immediately returned to the patient in their original bottle.  Medication #1: Hydrocodone/APAP Pill/Patch Count:  30 of 45 pills remain Pill/Patch Appearance: Markings consistent with prescribed medication Bottle Appearance: Standard pharmacy container. Clearly labeled. Filled Date: 07 / 29 / 2022 Last Medication intake:  Yesterday  Medication #2: Tramadol (Ultram) Pill/Patch Count:  49 of 60 pills remain Pill/Patch Appearance: Markings consistent with prescribed medication Bottle Appearance: Standard pharmacy container. Clearly labeled. Filled Date: 08 / 02 /  2022 Last Medication intake:  Today

## 2020-12-04 NOTE — Progress Notes (Signed)
PROVIDER NOTE: Information contained herein reflects review and annotations entered in association with encounter. Interpretation of such information and data should be left to medically-trained personnel. Information provided to patient can be located elsewhere in the medical record under "Patient Instructions". Document created using STT-dictation technology, any transcriptional errors that may result from process are unintentional.    Patient: Cynthia Dawson  Service Category: E/M  Provider: Gillis Santa, MD  DOB: 1945-10-09  DOS: 12/04/2020  Specialty: Interventional Pain Management  MRN: [id number]  Setting: Ambulatory outpatient  PCP: Dion Body, MD  Type: Established Patient    Referring Provider: Dion Body, MD  Location: Office  Delivery: Face-to-face     Primary Reason(s) for Visit: Encounter for evaluation before starting new chronic pain management plan of care (Level of risk: moderate) CC: Back Pain (Lower right)  HPI  Ms. Ayars is a 75 y.o. year old, female patient, who comes today for a follow-up evaluation to review the test results and decide on a treatment plan. She has Chronic back pain; Chronic pain syndrome; CKD (chronic kidney disease) stage 3, GFR 30-59 ml/min (HCC); Lumbosacral spondylosis without myelopathy; Type 2 diabetes mellitus with kidney complication, without long-term current use of insulin ([place]); Postlaminectomy syndrome of lumbosacral region; Status post lumbar spinal fusion (T10-L4); Status post thoracic spinal fusion (T10-L4); and Pain management contract signed on their problem list. Her primarily concern today is the Back Pain (Lower right)  Pain Assessment: Location: Lower, Right Back Radiating: when increased time on feet, radiates through right leg to foot Onset: More than a month ago Duration: Chronic pain Quality: Dull Severity: 6 /10 (subjective, self-reported pain score)  Effect on ADL: limits daily activities Timing:  Intermittent Modifying factors: lying down, elevate legs, meds BP: (!) 149/66  HR: 74  Patient states that she had COVID approximately 2 weeks ago.  She had increased congestion as well as cervical and shoulder pain.  She states that she has recovered and is doing better.  Otherwise she presents today for refill of her chronic pain regimen.  She continues gabapentin and has no refills at her pharmacy.  Today we will refill her Norco and tramadol.  No change in dose.  Patient is compliant with therapy.  Endorses analgesic response and improvement in functional status with intake of medications.   10/11/2020: No change in her medical history.  UDS up-to-date and appropriate.  I will have the patient sign pain contract and take over her chronic pain regimen as above.  No change in dose.  Patient was very appreciative that she is able to establish year as this is much closer to her home.  See HPI from 07/16/20  Arleta is a pleasant 75 year old female with a history of thoracolumbar fusion, T10-L4 that was done May 2015 for severe scoliosis and spinal stenosis.  Patient has been receiving her chronic pain management care at [facility] pain.  She states that it is very far for her and that her daughter has to take off of work to take her to her visits.  She is hoping to have her care transferred here which is closer to her home.  She would be able to drive to her appointments here by herself without having her daughter accompany her which would really help her out a lot she states.  I have reviewed clinic notes from her visits at [facility] pain medicine.  Patient's medication regimen is very reasonable and she has been compliant with therapy.   Current Pain Medication Regimen: -  Norco 5/325 mg BID (never takes more than 2x a day) - Tramadol 50 mg BID (never takes more than 2x per day) - Gabapentin 371m BID  - Topamax 50 mg BID - Voltaren gel   Her last visit with [facility] pain medicine was 07/09/2020 where she renewed her  pain contract and submitted a urine tox screen for annual medication compliance and monitoring.  There have been no concerns regarding her medication intake or medication history since she has been [facility]   I informed LAllannathat I will be happy to take her on for chronic opioid therapy here.   Pharmacotherapy Assessment  Analgesic:  - Norco 5/325 mg BID (never takes more than 2x a day) - Tramadol 50 mg BID (never takes more than 2x per day)  Monitoring: [place] PMP: PDMP reviewed during this encounter.       Pharmacotherapy: No side-effects or adverse reactions reported. Compliance: No problems identified. Effectiveness: Clinically acceptable.  UDS:  Summary  Date Value Ref Range Status  07/16/2020 Note  Final    Comment:    ==================================================================== Compliance Drug Analysis, Ur ==================================================================== Test                             Result       Flag       Units  Drug Present and Declared for Prescription Verification   Norhydrocodone                 163          EXPECTED   ng/mg creat    Norhydrocodone is an expected metabolite of hydrocodone.    Tramadol                       6948         EXPECTED   ng/mg creat   O-Desmethyltramadol            >7463        EXPECTED   ng/mg creat   N-Desmethyltramadol            618          EXPECTED   ng/mg creat    Source of tramadol is a prescription medication. O-desmethyltramadol    and N-desmethyltramadol are expected metabolites of tramadol.    Gabapentin                     PRESENT      EXPECTED   Topiramate                     PRESENT      EXPECTED   Acetaminophen                  PRESENT      EXPECTED   Metoprolol                     PRESENT      EXPECTED  Drug Present not Declared for Prescription Verification   Ephedrine/Pseudoephedrine      PRESENT      UNEXPECTED   Phenylpropanolamine            PRESENT      UNEXPECTED     Source of ephedrine/pseudoephedrine is most commonly pseudoephedrine    in over-the-counter or prescription cold and allergy medications.    Phenylpropanolamine is an expected metabolite of  ephedrine/pseudoephedrine.  Drug Absent but Declared for Prescription Verification   Hydrocodone                    Not Detected UNEXPECTED ng/mg creat    Hydrocodone is almost always present in patients taking this drug    consistently. Absence of hydrocodone could be due to lapse of time    since the last dose or unusual pharmacokinetics (rapid metabolism).    Diclofenac                     Not Detected UNEXPECTED    Topical diclofenac, as indicated in the declared medication list, is    not always detected even when used as directed.    Salicylate                     Not Detected UNEXPECTED    Aspirin, as indicated in the declared medication list, is not always    detected even when used as directed.  ==================================================================== Test                      Result    Flag   Units      Ref Range   Creatinine              67               mg/dL      >=20 ==================================================================== Declared Medications:  The flagging and interpretation on this report are based on the  following declared medications.  Unexpected results may arise from  inaccuracies in the declared medications.   **Note: The testing scope of this panel includes these medications:   Gabapentin (Neurontin)  Hydrocodone (Norco)  Metoprolol  Topiramate (Topamax)  Tramadol (Ultram)   **Note: The testing scope of this panel does not include small to  moderate amounts of these reported medications:   Acetaminophen (Norco)  Aspirin  Topical Diclofenac (Voltaren)   **Note: The testing scope of this panel does not include the  following reported medications:   Atorvastatin (Lipitor)  Calcium  Fenofibrate (TriCor)  Levothyroxine (Synthroid)   Lisinopril (Zestril)  Metformin (Glucophage)  Nystatin (Mycostatin)  Vitamin D3 ==================================================================== For clinical consultation, please call [phone]. ====================================================================       Laboratory Chemistry Profile   Renal Lab Results  Component Value Date   PROTEINUR 100 mg/dL 09/28/2013     Electrolytes No results found for: NA, K, CL, CALCIUM, MG, PHOS   Hepatic No results found for: AST, ALT, ALBUMIN, ALKPHOS, AMYLASE, LIPASE, AMMONIA   ID Lab Results  Component Value Date   SARSCOV2NAA NEGATIVE 02/25/2019     Bone No results found for: VD25OH, [id number], MW4132GM0, NU2725DG6, 25OHVITD1, 25OHVITD2, 25OHVITD3, TESTOFREE, TESTOSTERONE   Endocrine Lab Results  Component Value Date   GLUCOSEU Negative 09/28/2013   HGBA1C 5.5 09/06/2013     Neuropathy Lab Results  Component Value Date   HGBA1C 5.5 09/06/2013     CNS No results found for: COLORCSF, APPEARCSF, RBCCOUNTCSF, WBCCSF, POLYSCSF, LYMPHSCSF, EOSCSF, PROTEINCSF, GLUCCSF, JCVIRUS, CSFOLI, IGGCSF, LABACHR, ACETBL, LABACHR, ACETBL   Inflammation (CRP: Acute  ESR: Chronic) No results found for: CRP, ESRSEDRATE, LATICACIDVEN   Rheumatology No results found for: RF, ANA, LABURIC, URICUR, LYMEIGGIGMAB, LYMEABIGMQN, HLAB27   Coagulation Lab Results  Component Value Date   PLT 446 (H) 09/08/2013     Cardiovascular Lab Results  Component Value Date   HGB 8.9 (L) 09/08/2013  HCT 27.2 (L) 09/08/2013     Screening Lab Results  Component Value Date   SARSCOV2NAA NEGATIVE 02/25/2019     Cancer No results found for: CEA, CA125, LABCA2   Allergens No results found for: ALMOND, APPLE, ASPARAGUS, AVOCADO, BANANA, BARLEY, BASIL, BAYLEAF, GREENBEAN, LIMABEAN, WHITEBEAN, BEEFIGE, REDBEET, BLUEBERRY, BROCCOLI, CABBAGE, MELON, CARROT, CASEIN, CASHEWNUT, CAULIFLOWER, CELERY     Note: Lab results reviewed.   Meds    Current Outpatient Medications:    aspirin EC 81 MG tablet, Take 81 mg by mouth daily., Disp: , Rfl:    atorvastatin (LIPITOR) 20 MG tablet, Take 20 mg by mouth daily., Disp: , Rfl:    Calcium Carbonate-Vit D-Min (CALCIUM 1200 PO), Take by mouth., Disp: , Rfl:    Cholecalciferol (VITAMIN D3) 125 MCG (5000 UT) CAPS, Take by mouth., Disp: , Rfl:    diclofenac sodium (VOLTAREN) 1 % GEL, Apply topically as needed., Disp: , Rfl:    fenofibrate (TRICOR) 145 MG tablet, Take 145 mg by mouth daily., Disp: , Rfl:    gabapentin (NEURONTIN) 300 MG capsule, Take 1 capsule (300 mg total) by mouth 2 (two) times daily., Disp: 60 capsule, Rfl: 5   levothyroxine (SYNTHROID, LEVOTHROID) 75 MCG tablet, Take 75 mcg by mouth daily before breakfast., Disp: , Rfl:    lisinopril (PRINIVIL,ZESTRIL) 2.5 MG tablet, Take 2.5 mg by mouth daily., Disp: , Rfl:    metFORMIN (GLUCOPHAGE) 1000 MG tablet, Take 500 mg by mouth daily with breakfast., Disp: , Rfl:    METOPROLOL TARTRATE PO, Take 25 mg by mouth daily., Disp: , Rfl:    nystatin cream (MYCOSTATIN), Apply 1 application topically as needed for dry skin., Disp: , Rfl:    topiramate (TOPAMAX) 50 MG tablet, Take 1 tablet (50 mg total) by mouth 2 (two) times daily., Disp: 60 tablet, Rfl: 5   [START ON 12/16/2020] HYDROcodone-acetaminophen (NORCO/VICODIN) 5-325 MG tablet, Take 1-2 tablets by mouth daily as needed for moderate pain. For chronic pain syndrome, Disp: 45 tablet, Rfl: 0   [START ON 01/15/2021] HYDROcodone-acetaminophen (NORCO/VICODIN) 5-325 MG tablet, Take 1-2 tablets by mouth daily as needed for moderate pain. For chronic pain syndrome, Disp: 45 tablet, Rfl: 0   [START ON 12/20/2020] traMADol (ULTRAM) 50 MG tablet, Take 1 tablet (50 mg total) by mouth every 12 (twelve) hours as needed. For chronic pain, Disp: 60 tablet, Rfl: 1  ROS  Constitutional: Denies any fever or chills Gastrointestinal: No reported hemesis, hematochezia, vomiting, or acute GI  distress Musculoskeletal: Denies any acute onset joint swelling, redness, loss of ROM, or weakness Neurological: No reported episodes of acute onset apraxia, aphasia, dysarthria, agnosia, amnesia, paralysis, loss of coordination, or loss of consciousness  Allergies  Ms. Marion is allergic to aspartame and phenylalanine, penicillins, and adhesive [tape].  PFSH  Drug: Ms. Vanpelt  reports no history of drug use. Alcohol:  reports no history of alcohol use. Tobacco:  reports that she has never smoked. She has never used smokeless tobacco. Medical:  has a past medical history of Arthritis, Diabetes mellitus without complication ([place]), Heart murmur, Hypercholesteremia, Hypertension, Hypothyroidism, and Neuropathy. Surgical: Ms. Pullin  has a past surgical history that includes Back surgery; Cesarean section; Cholecystectomy; Abdominal hysterectomy; Cataract extraction w/PHACO (Right, 07/03/2015); Eye surgery; Colonoscopy with propofol (N/A, 06/06/2016); and Cataract extraction w/PHACO (Left, 03/01/2019). Family: family history includes Breast cancer in her paternal aunt.  Constitutional Exam  General appearance: Well nourished, well developed, and well hydrated. In no apparent acute distress Vitals:   12/04/20 1352  BP: (!) 149/66  Pulse: 74  Resp: 16  Temp: (!) 96.7 F (35.9 C)  TempSrc: Temporal  SpO2: 100%  Weight: 183 lb (83 kg)  Height: '4\' 10"'  (1.473 m)   BMI Assessment: Estimated body mass index is 38.25 kg/m as calculated from the following:   Height as of this encounter: '4\' 10"'  (1.473 m).   Weight as of this encounter: 183 lb (83 kg).  BMI interpretation table: BMI level Category Range association with higher incidence of chronic pain  <18 kg/m2 Underweight   18.5-24.9 kg/m2 Ideal body weight   25-29.9 kg/m2 Overweight Increased incidence by 20%  30-34.9 kg/m2 Obese (Class I) Increased incidence by 68%  35-39.9 kg/m2 Severe obesity (Class II) Increased incidence by 136%  >40  kg/m2 Extreme obesity (Class III) Increased incidence by 254%   Patient's current BMI Ideal Body weight  Body mass index is 38.25 kg/m. Patient must be at least 60 in tall to calculate ideal body weight   BMI Readings from Last 4 Encounters:  12/04/20 38.25 kg/m  10/11/20 38.25 kg/m  07/16/20 36.96 kg/m  03/01/19 38.46 kg/m   Wt Readings from Last 4 Encounters:  12/04/20 183 lb (83 kg)  10/11/20 183 lb (83 kg)  07/16/20 183 lb (83 kg)  03/01/19 184 lb (83.5 kg)    Psych/Mental status: Alert, oriented x 3 (person, place, & time)       Eyes: PERLA Respiratory: No evidence of acute respiratory distress  Thoracic Spine Area Exam  Skin & Axial Inspection: Well healed scar from previous spine surgery detected Alignment: Asymmetric Functional ROM: Mechanically restricted ROM Stability: No instability detected Muscle Tone/Strength: Functionally intact. No obvious neuro-muscular anomalies detected. Sensory (Neurological): Dermatomal pain pattern   Lumbar Exam  Skin & Axial Inspection: Well healed scar from previous spine surgery detected Alignment: Asymmetric Functional ROM: Mechanically restricted ROM       Stability: No instability detected Muscle Tone/Strength: Functionally intact. No obvious neuro-muscular anomalies detected. Sensory (Neurological): Dermatomal pain pattern right hip/leg   Gait & Posture Assessment  Ambulation: Unassisted Gait: Antalgic Posture: Difficulty standing up straight, due to pain    Lower Extremity Exam      Side: Right lower extremity   Side: Left lower extremity  Stability: No instability observed           Stability: No instability observed          Skin & Extremity Inspection: Skin color, temperature, and hair growth are WNL. No peripheral edema or cyanosis. No masses, redness, swelling, asymmetry, or associated skin lesions. No contractures.   Skin & Extremity Inspection: Skin color, temperature, and hair growth are WNL. No peripheral edema  or cyanosis. No masses, redness, swelling, asymmetry, or associated skin lesions. No contractures.  Functional ROM: Pain restricted ROM for hip and knee joints           Functional ROM: Unrestricted ROM                  Muscle Tone/Strength: Functionally intact. No obvious neuro-muscular anomalies detected.   Muscle Tone/Strength: Functionally intact. No obvious neuro-muscular anomalies detected.  Sensory (Neurological): Dermatomal pain pattern medial portion of foot (L4)   Sensory (Neurological): Unimpaired        DTR: Patellar: deferred today Achilles: deferred today Plantar: deferred today   DTR: Patellar: deferred today Achilles: deferred today Plantar: deferred today  Palpation: No palpable anomalies   Palpation: No palpable anomalies    Assessment & Plan  Primary Diagnosis &  Pertinent Problem List: Diagnoses of Chronic pain syndrome, Lumbosacral spondylosis without myelopathy, Status post lumbar spinal fusion (T10-L4), and Postlaminectomy syndrome of lumbosacral region were pertinent to this visit.  Visit Diagnosis: 1. Chronic pain syndrome   2. Lumbosacral spondylosis without myelopathy   3. Status post lumbar spinal fusion (T10-L4)   4. Postlaminectomy syndrome of lumbosacral region      Plan of Care  Pharmacotherapy (Medications Ordered): Meds ordered this encounter  Medications   HYDROcodone-acetaminophen (NORCO/VICODIN) 5-325 MG tablet    Sig: Take 1-2 tablets by mouth daily as needed for moderate pain. For chronic pain syndrome    Dispense:  45 tablet    Refill:  0   HYDROcodone-acetaminophen (NORCO/VICODIN) 5-325 MG tablet    Sig: Take 1-2 tablets by mouth daily as needed for moderate pain. For chronic pain syndrome    Dispense:  45 tablet    Refill:  0   traMADol (ULTRAM) 50 MG tablet    Sig: Take 1 tablet (50 mg total) by mouth every 12 (twelve) hours as needed. For chronic pain    Dispense:  60 tablet    Refill:  1  Continue gabapentin and Topamax as  prescribed.  No refills needed.    Provider-requested follow-up: Return in about 2 months (around 02/07/2021) for Medication Management, in person. Recent Visits Date Type Provider Dept  10/11/20 Office Visit Gillis Santa, MD [facility]  Showing recent visits within past 90 days and meeting all other requirements Today's Visits Date Type Provider Dept  12/04/20 Office Visit Gillis Santa, MD [facility]  Showing today's visits and meeting all other requirements Future Appointments Date Type Provider Dept  02/07/21 Appointment Gillis Santa, MD [facility]  Showing future appointments within next 90 days and meeting all other requirements Primary Care Physician: Dion Body, MD Note by: Gillis Santa, MD Date: 12/04/2020; Time: 2:27 PM

## 2020-12-18 DIAGNOSIS — E039 Hypothyroidism, unspecified: Secondary | ICD-10-CM | POA: Diagnosis not present

## 2020-12-18 DIAGNOSIS — E782 Mixed hyperlipidemia: Secondary | ICD-10-CM | POA: Diagnosis not present

## 2020-12-18 DIAGNOSIS — E1122 Type 2 diabetes mellitus with diabetic chronic kidney disease: Secondary | ICD-10-CM | POA: Diagnosis not present

## 2020-12-18 DIAGNOSIS — N1831 Chronic kidney disease, stage 3a: Secondary | ICD-10-CM | POA: Diagnosis not present

## 2020-12-18 DIAGNOSIS — I10 Essential (primary) hypertension: Secondary | ICD-10-CM | POA: Diagnosis not present

## 2020-12-25 DIAGNOSIS — Z6837 Body mass index (BMI) 37.0-37.9, adult: Secondary | ICD-10-CM | POA: Diagnosis not present

## 2020-12-25 DIAGNOSIS — N1831 Chronic kidney disease, stage 3a: Secondary | ICD-10-CM | POA: Diagnosis not present

## 2020-12-25 DIAGNOSIS — E1122 Type 2 diabetes mellitus with diabetic chronic kidney disease: Secondary | ICD-10-CM | POA: Diagnosis not present

## 2020-12-25 DIAGNOSIS — I129 Hypertensive chronic kidney disease with stage 1 through stage 4 chronic kidney disease, or unspecified chronic kidney disease: Secondary | ICD-10-CM | POA: Diagnosis not present

## 2020-12-25 DIAGNOSIS — E039 Hypothyroidism, unspecified: Secondary | ICD-10-CM | POA: Diagnosis not present

## 2020-12-25 DIAGNOSIS — E782 Mixed hyperlipidemia: Secondary | ICD-10-CM | POA: Diagnosis not present

## 2021-02-07 ENCOUNTER — Ambulatory Visit
Payer: Medicare HMO | Attending: Student in an Organized Health Care Education/Training Program | Admitting: Student in an Organized Health Care Education/Training Program

## 2021-02-07 ENCOUNTER — Encounter: Payer: Self-pay | Admitting: Student in an Organized Health Care Education/Training Program

## 2021-02-07 ENCOUNTER — Other Ambulatory Visit: Payer: Self-pay

## 2021-02-07 VITALS — BP 137/83 | HR 77 | Temp 98.6°F | Resp 15 | Ht <= 58 in | Wt 183.0 lb

## 2021-02-07 DIAGNOSIS — G894 Chronic pain syndrome: Secondary | ICD-10-CM | POA: Insufficient documentation

## 2021-02-07 DIAGNOSIS — M47817 Spondylosis without myelopathy or radiculopathy, lumbosacral region: Secondary | ICD-10-CM | POA: Diagnosis not present

## 2021-02-07 DIAGNOSIS — M961 Postlaminectomy syndrome, not elsewhere classified: Secondary | ICD-10-CM | POA: Diagnosis not present

## 2021-02-07 DIAGNOSIS — Z981 Arthrodesis status: Secondary | ICD-10-CM | POA: Diagnosis not present

## 2021-02-07 DIAGNOSIS — Z0289 Encounter for other administrative examinations: Secondary | ICD-10-CM | POA: Insufficient documentation

## 2021-02-07 MED ORDER — HYDROCODONE-ACETAMINOPHEN 5-325 MG PO TABS: 1.0000 | ORAL_TABLET | Freq: Every day | ORAL | 0 refills | Status: DC | PRN

## 2021-02-07 MED ORDER — TOPIRAMATE 50 MG PO TABS: 50.0000 mg | ORAL_TABLET | Freq: Two times a day (BID) | ORAL | 5 refills | Status: DC

## 2021-02-07 MED ORDER — TRAMADOL HCL 50 MG PO TABS: 50.0000 mg | ORAL_TABLET | Freq: Two times a day (BID) | ORAL | 2 refills | Status: DC | PRN

## 2021-02-07 MED ORDER — HYDROCODONE-ACETAMINOPHEN 5-325 MG PO TABS: 1.0000 | ORAL_TABLET | Freq: Every day | ORAL | 0 refills | Status: AC | PRN

## 2021-02-07 MED ORDER — GABAPENTIN 300 MG PO CAPS: 300.0000 mg | ORAL_CAPSULE | Freq: Two times a day (BID) | ORAL | 5 refills | Status: DC

## 2021-02-07 NOTE — Progress Notes (Signed)
PROVIDER NOTE: Information contained herein reflects review and annotations entered in association with encounter. Interpretation of such information and data should be left to medically-trained personnel. Information provided to patient can be located elsewhere in the medical record under "Patient Instructions". Document created using STT-dictation technology, any transcriptional errors that may result from process are unintentional.    Patient: Cynthia Dawson  Service Category: E/M  Provider: Gillis Santa, MD  DOB: Jul 30, 1945  DOS: 02/07/2021  Specialty: Interventional Pain Management  MRN: [id number]  Setting: Ambulatory outpatient  PCP: Dion Body, MD  Type: Established Patient    Referring Provider: Dion Body, MD  Location: Office  Delivery: Face-to-face     Primary Reason(s) for Visit: Encounter for evaluation before starting new chronic pain management plan of care (Level of risk: moderate) CC: Back Pain (lower)  HPI  Cynthia Dawson is a 75 y.o. year old, female patient, who comes today for a follow-up evaluation to review the test results and decide on a treatment plan. She has Chronic back pain; Chronic pain syndrome; CKD (chronic kidney disease) stage 3, GFR 30-59 ml/min (HCC); Lumbosacral spondylosis without myelopathy; Type 2 diabetes mellitus with kidney complication, without long-term current use of insulin ([place]); Postlaminectomy syndrome of lumbosacral region; Status post lumbar spinal fusion (T10-L4); Status post thoracic spinal fusion (T10-L4); and Pain management contract signed on their problem list. Her primarily concern today is the Back Pain (lower)  Pain Assessment: Location: Lower, Right Back Radiating: to right hip Onset: More than a month ago Duration: Chronic pain Quality: Aching Severity: 7 /10 (subjective, self-reported pain score)  Effect on ADL: limits daily activities Timing: Constant Modifying factors: meds BP: 137/83  HR: 77  No change in medical  history since last visit.  Patient's pain is at baseline.  Patient continues multimodal pain regimen as prescribed.  States that it provides pain relief and improvement in functional status.   12/04/20: Patient states that she had COVID approximately 2 weeks ago.  She had increased congestion as well as cervical and shoulder pain.  She states that she has recovered and is doing better.  Otherwise she presents today for refill of her chronic pain regimen.  She continues gabapentin and has no refills at her pharmacy.  Today we will refill her Norco and tramadol.  No change in dose.  Patient is compliant with therapy.  Endorses analgesic response and improvement in functional status with intake of medications.   10/11/2020: No change in her medical history.  UDS up-to-date and appropriate.  I will have the patient sign pain contract and take over her chronic pain regimen as above.  No change in dose.  Patient was very appreciative that she is able to establish year as this is much closer to her home.  See HPI from 07/16/20  Cynthia Dawson is a pleasant 75 year old female with a history of thoracolumbar fusion, T10-L4 that was done May 2015 for severe scoliosis and spinal stenosis.  Patient has been receiving her chronic pain management care at [facility].  She states that it is very far for her and that her daughter has to take off of work to take her to her visits.  She is hoping to have her care transferred here which is closer to her home.  She would be able to drive to her appointments here by herself without having her daughter accompany her which would really help her out a lot she states.  I have reviewed clinic notes from her visits at [facility] pain  medicine.  Patient's medication regimen is very reasonable and she has been compliant with therapy.   Current Pain Medication Regimen: - Norco 5/325 mg BID (never takes more than 2x a day) - Tramadol 50 mg BID (never takes more than 2x per day) - Gabapentin 346m BID   - Topamax 50 mg BID - Voltaren gel   Her last visit with [facility] pain medicine was 07/09/2020 where she renewed her pain contract and submitted a urine tox screen for annual medication compliance and monitoring.  There have been no concerns regarding her medication intake or medication history since [facility]     Pharmacotherapy Assessment  Analgesic:  - Norco 5/325 mg BID (never takes more than 2x a day) - Tramadol 50 mg BID (never takes more than 2x per day)  Monitoring: [place] PMP: PDMP not reviewed this encounter.       Pharmacotherapy: No side-effects or adverse reactions reported. Compliance: No problems identified. Effectiveness: Clinically acceptable.  UDS:  Summary  Date Value Ref Range Status  07/16/2020 Note  Final    Comment:    ==================================================================== Compliance Drug Analysis, Ur ==================================================================== Test                             Result       Flag       Units  Drug Present and Declared for Prescription Verification   Norhydrocodone                 163          EXPECTED   ng/mg creat    Norhydrocodone is an expected metabolite of hydrocodone.    Tramadol                       6948         EXPECTED   ng/mg creat   O-Desmethyltramadol            >7463        EXPECTED   ng/mg creat   N-Desmethyltramadol            618          EXPECTED   ng/mg creat    Source of tramadol is a prescription medication. O-desmethyltramadol    and N-desmethyltramadol are expected metabolites of tramadol.    Gabapentin                     PRESENT      EXPECTED   Topiramate                     PRESENT      EXPECTED   Acetaminophen                  PRESENT      EXPECTED   Metoprolol                     PRESENT      EXPECTED  Drug Present not Declared for Prescription Verification   Ephedrine/Pseudoephedrine      PRESENT      UNEXPECTED   Phenylpropanolamine            PRESENT       UNEXPECTED    Source of ephedrine/pseudoephedrine is most commonly pseudoephedrine    in over-the-counter or prescription cold and allergy medications.    Phenylpropanolamine is  an expected metabolite of    ephedrine/pseudoephedrine.  Drug Absent but Declared for Prescription Verification   Hydrocodone                    Not Detected UNEXPECTED ng/mg creat    Hydrocodone is almost always present in patients taking this drug    consistently. Absence of hydrocodone could be due to lapse of time    since the last dose or unusual pharmacokinetics (rapid metabolism).    Diclofenac                     Not Detected UNEXPECTED    Topical diclofenac, as indicated in the declared medication list, is    not always detected even when used as directed.    Salicylate                     Not Detected UNEXPECTED    Aspirin, as indicated in the declared medication list, is not always    detected even when used as directed.  ==================================================================== Test                      Result    Flag   Units      Ref Range   Creatinine              67               mg/dL      >=20 ==================================================================== Declared Medications:  The flagging and interpretation on this report are based on the  following declared medications.  Unexpected results may arise from  inaccuracies in the declared medications.   **Note: The testing scope of this panel includes these medications:   Gabapentin (Neurontin)  Hydrocodone (Norco)  Metoprolol  Topiramate (Topamax)  Tramadol (Ultram)   **Note: The testing scope of this panel does not include small to  moderate amounts of these reported medications:   Acetaminophen (Norco)  Aspirin  Topical Diclofenac (Voltaren)   **Note: The testing scope of this panel does not include the  following reported medications:   Atorvastatin (Lipitor)  Calcium  Fenofibrate (TriCor)   Levothyroxine (Synthroid)  Lisinopril (Zestril)  Metformin (Glucophage)  Nystatin (Mycostatin)  Vitamin D3 ==================================================================== For clinical consultation, please call [phone]. ====================================================================       Laboratory Chemistry Profile   Renal Lab Results  Component Value Date   PROTEINUR 100 mg/dL 09/28/2013     Electrolytes No results found for: NA, K, CL, CALCIUM, MG, PHOS   Hepatic No results found for: AST, ALT, ALBUMIN, ALKPHOS, AMYLASE, LIPASE, AMMONIA   ID Lab Results  Component Value Date   SARSCOV2NAA NEGATIVE 02/25/2019     Bone No results found for: VD25OH, HQ469GE9BMW, UX3244WN0, UV2536UY4, 25OHVITD1, 25OHVITD2, 25OHVITD3, TESTOFREE, TESTOSTERONE   Endocrine Lab Results  Component Value Date   GLUCOSEU Negative 09/28/2013   HGBA1C 5.5 09/06/2013     Neuropathy Lab Results  Component Value Date   HGBA1C 5.5 09/06/2013     CNS No results found for: COLORCSF, APPEARCSF, RBCCOUNTCSF, WBCCSF, POLYSCSF, LYMPHSCSF, EOSCSF, PROTEINCSF, GLUCCSF, JCVIRUS, CSFOLI, IGGCSF, LABACHR, ACETBL, LABACHR, ACETBL   Inflammation (CRP: Acute  ESR: Chronic) No results found for: CRP, ESRSEDRATE, LATICACIDVEN   Rheumatology No results found for: RF, ANA, LABURIC, URICUR, LYMEIGGIGMAB, LYMEABIGMQN, HLAB27   Coagulation Lab Results  Component Value Date   PLT 446 (H) 09/08/2013     Cardiovascular Lab Results  Component Value Date  HGB 8.9 (L) 09/08/2013   HCT 27.2 (L) 09/08/2013     Screening Lab Results  Component Value Date   SARSCOV2NAA NEGATIVE 02/25/2019     Cancer No results found for: CEA, CA125, LABCA2   Allergens No results found for: ALMOND, APPLE, ASPARAGUS, AVOCADO, BANANA, BARLEY, BASIL, BAYLEAF, GREENBEAN, LIMABEAN, WHITEBEAN, BEEFIGE, REDBEET, BLUEBERRY, BROCCOLI, CABBAGE, MELON, CARROT, CASEIN, CASHEWNUT, CAULIFLOWER, CELERY     Note: Lab  results reviewed.   Meds   Current Outpatient Medications:    aspirin EC 81 MG tablet, Take 81 mg by mouth daily., Disp: , Rfl:    atorvastatin (LIPITOR) 20 MG tablet, Take 20 mg by mouth daily., Disp: , Rfl:    Calcium Carbonate-Vit D-Min (CALCIUM 1200 PO), Take by mouth., Disp: , Rfl:    Cholecalciferol (VITAMIN D3) 125 MCG (5000 UT) CAPS, Take by mouth., Disp: , Rfl:    diclofenac sodium (VOLTAREN) 1 % GEL, Apply topically as needed., Disp: , Rfl:    fenofibrate (TRICOR) 145 MG tablet, Take 145 mg by mouth daily., Disp: , Rfl:    levothyroxine (SYNTHROID, LEVOTHROID) 75 MCG tablet, Take 75 mcg by mouth daily before breakfast., Disp: , Rfl:    lisinopril (PRINIVIL,ZESTRIL) 2.5 MG tablet, Take 2.5 mg by mouth daily., Disp: , Rfl:    metFORMIN (GLUCOPHAGE) 1000 MG tablet, Take 500 mg by mouth daily with breakfast., Disp: , Rfl:    METOPROLOL TARTRATE PO, Take 25 mg by mouth daily., Disp: , Rfl:    nystatin cream (MYCOSTATIN), Apply 1 application topically as needed for dry skin., Disp: , Rfl:    gabapentin (NEURONTIN) 300 MG capsule, Take 1 capsule (300 mg total) by mouth 2 (two) times daily., Disp: 60 capsule, Rfl: 5   [START ON 02/15/2021] HYDROcodone-acetaminophen (NORCO/VICODIN) 5-325 MG tablet, Take 1-2 tablets by mouth daily as needed for moderate pain. For chronic pain syndrome, Disp: 45 tablet, Rfl: 0   [START ON 03/17/2021] HYDROcodone-acetaminophen (NORCO/VICODIN) 5-325 MG tablet, Take 1-2 tablets by mouth daily as needed for moderate pain. For chronic pain syndrome, Disp: 45 tablet, Rfl: 0   [START ON 04/16/2021] HYDROcodone-acetaminophen (NORCO/VICODIN) 5-325 MG tablet, Take 1-2 tablets by mouth daily as needed for moderate pain. For chronic pain syndrome, Disp: 45 tablet, Rfl: 0   topiramate (TOPAMAX) 50 MG tablet, Take 1 tablet (50 mg total) by mouth 2 (two) times daily., Disp: 60 tablet, Rfl: 5   [START ON 03/06/2021] traMADol (ULTRAM) 50 MG tablet, Take 1 tablet (50 mg total) by  mouth every 12 (twelve) hours as needed. For chronic pain, Disp: 60 tablet, Rfl: 2  ROS  Constitutional: Denies any fever or chills Gastrointestinal: No reported hemesis, hematochezia, vomiting, or acute GI distress Musculoskeletal: Denies any acute onset joint swelling, redness, loss of ROM, or weakness Neurological: No reported episodes of acute onset apraxia, aphasia, dysarthria, agnosia, amnesia, paralysis, loss of coordination, or loss of consciousness  Allergies  Cynthia Dawson is allergic to aspartame and phenylalanine, penicillins, and adhesive [tape].  PFSH  Drug: Cynthia Dawson  reports no history of drug use. Alcohol:  reports no history of alcohol use. Tobacco:  reports that she has never smoked. She has never used smokeless tobacco. Medical:  has a past medical history of Arthritis, Diabetes mellitus without complication ([place]), Heart murmur, Hypercholesteremia, Hypertension, Hypothyroidism, and Neuropathy. Surgical: Cynthia Dawson  has a past surgical history that includes Back surgery; Cesarean section; Cholecystectomy; Abdominal hysterectomy; Cataract extraction w/PHACO (Right, 07/03/2015); Eye surgery; Colonoscopy with propofol (N/A, 06/06/2016); and Cataract extraction  w/PHACO (Left, 03/01/2019). Family: family history includes Breast cancer in her paternal aunt.  Constitutional Exam  General appearance: Well nourished, well developed, and well hydrated. In no apparent acute distress Vitals:   02/07/21 1436  BP: 137/83  Pulse: 77  Resp: 15  Temp: 98.6 F (37 C)  TempSrc: Oral  SpO2: 98%  Weight: 183 lb (83 kg)  Height: '4\' 10"'  (1.473 m)   BMI Assessment: Estimated body mass index is 38.25 kg/m as calculated from the following:   Height as of this encounter: '4\' 10"'  (1.473 m).   Weight as of this encounter: 183 lb (83 kg).  BMI interpretation table: BMI level Category Range association with higher incidence of chronic pain  <18 kg/m2 Underweight   18.5-24.9 kg/m2 Ideal body  weight   25-29.9 kg/m2 Overweight Increased incidence by 20%  30-34.9 kg/m2 Obese (Class I) Increased incidence by 68%  35-39.9 kg/m2 Severe obesity (Class II) Increased incidence by 136%  >40 kg/m2 Extreme obesity (Class III) Increased incidence by 254%   Patient's current BMI Ideal Body weight  Body mass index is 38.25 kg/m. Patient must be at least 60 in tall to calculate ideal body weight   BMI Readings from Last 4 Encounters:  02/07/21 38.25 kg/m  12/04/20 38.25 kg/m  10/11/20 38.25 kg/m  07/16/20 36.96 kg/m   Wt Readings from Last 4 Encounters:  02/07/21 183 lb (83 kg)  12/04/20 183 lb (83 kg)  10/11/20 183 lb (83 kg)  07/16/20 183 lb (83 kg)    Psych/Mental status: Alert, oriented x 3 (person, place, & time)       Eyes: PERLA Respiratory: No evidence of acute respiratory distress  Thoracic Spine Area Exam  Skin & Axial Inspection: Well healed scar from previous spine surgery detected Alignment: Asymmetric Functional ROM: Mechanically restricted ROM Stability: No instability detected Muscle Tone/Strength: Functionally intact. No obvious neuro-muscular anomalies detected. Sensory (Neurological): Dermatomal pain pattern   Lumbar Exam  Skin & Axial Inspection: Well healed scar from previous spine surgery detected Alignment: Asymmetric Functional ROM: Mechanically restricted ROM       Stability: No instability detected Muscle Tone/Strength: Functionally intact. No obvious neuro-muscular anomalies detected. Sensory (Neurological): Dermatomal pain pattern right hip/leg   Gait & Posture Assessment  Ambulation: Unassisted Gait: Antalgic Posture: Difficulty standing up straight, due to pain    Lower Extremity Exam      Side: Right lower extremity   Side: Left lower extremity  Stability: No instability observed           Stability: No instability observed          Skin & Extremity Inspection: Skin color, temperature, and hair growth are WNL. No peripheral edema or  cyanosis. No masses, redness, swelling, asymmetry, or associated skin lesions. No contractures.   Skin & Extremity Inspection: Skin color, temperature, and hair growth are WNL. No peripheral edema or cyanosis. No masses, redness, swelling, asymmetry, or associated skin lesions. No contractures.  Functional ROM: Pain restricted ROM for hip and knee joints           Functional ROM: Unrestricted ROM                  Muscle Tone/Strength: Functionally intact. No obvious neuro-muscular anomalies detected.   Muscle Tone/Strength: Functionally intact. No obvious neuro-muscular anomalies detected.  Sensory (Neurological): Dermatomal pain pattern medial portion of foot (L4)   Sensory (Neurological): Unimpaired        DTR: Patellar: deferred today Achilles: deferred today Plantar:  deferred today   DTR: Patellar: deferred today Achilles: deferred today Plantar: deferred today  Palpation: No palpable anomalies   Palpation: No palpable anomalies    Assessment & Plan  Primary Diagnosis & Pertinent Problem List: The primary encounter diagnosis was Chronic pain syndrome. Diagnoses of Lumbosacral spondylosis without myelopathy, Status post lumbar spinal fusion (T10-L4), Postlaminectomy syndrome of lumbosacral region, Status post thoracic spinal fusion (T10-L4), and Pain management contract signed were also pertinent to this visit.  Visit Diagnosis: 1. Chronic pain syndrome   2. Lumbosacral spondylosis without myelopathy   3. Status post lumbar spinal fusion (T10-L4)   4. Postlaminectomy syndrome of lumbosacral region   5. Status post thoracic spinal fusion (T10-L4)   6. Pain management contract signed       Plan of Care  Pharmacotherapy (Medications Ordered): Meds ordered this encounter  Medications   traMADol (ULTRAM) 50 MG tablet    Sig: Take 1 tablet (50 mg total) by mouth every 12 (twelve) hours as needed. For chronic pain    Dispense:  60 tablet    Refill:  2   HYDROcodone-acetaminophen  (NORCO/VICODIN) 5-325 MG tablet    Sig: Take 1-2 tablets by mouth daily as needed for moderate pain. For chronic pain syndrome    Dispense:  45 tablet    Refill:  0   HYDROcodone-acetaminophen (NORCO/VICODIN) 5-325 MG tablet    Sig: Take 1-2 tablets by mouth daily as needed for moderate pain. For chronic pain syndrome    Dispense:  45 tablet    Refill:  0   HYDROcodone-acetaminophen (NORCO/VICODIN) 5-325 MG tablet    Sig: Take 1-2 tablets by mouth daily as needed for moderate pain. For chronic pain syndrome    Dispense:  45 tablet    Refill:  0   gabapentin (NEURONTIN) 300 MG capsule    Sig: Take 1 capsule (300 mg total) by mouth 2 (two) times daily.    Dispense:  60 capsule    Refill:  5   topiramate (TOPAMAX) 50 MG tablet    Sig: Take 1 tablet (50 mg total) by mouth 2 (two) times daily.    Dispense:  60 tablet    Refill:  5      Provider-requested follow-up: Return in about 3 months (around 05/14/2021) for Medication Management, in person. Recent Visits Date Type Provider Dept  12/04/20 Office Visit Gillis Santa, MD [facility]  Showing recent visits within past 90 days and meeting all other requirements Today's Visits Date Type Provider Dept  02/07/21 Office Visit Gillis Santa, MD [facility]  Showing today's visits and meeting all other requirements Future Appointments No visits were found meeting these conditions. Showing future appointments within next 90 days and meeting all other requirements Primary Care Physician: Dion Body, MD Note by: Gillis Santa, MD Date: 02/07/2021; Time: 2:53 PM

## 2021-02-07 NOTE — Progress Notes (Signed)
Nursing Pain Medication Assessment:  Safety precautions to be maintained throughout the outpatient stay will include: orient to surroundings, keep bed in low position, maintain call bell within reach at all times, provide assistance with transfer out of bed and ambulation.  Medication Inspection Compliance: Pill count conducted under aseptic conditions, in front of the patient. Neither the pills nor the bottle was removed from the patient's sight at any time. Once count was completed pills were immediately returned to the patient in their original bottle.  Medication #1: Tramadol (Ultram) Pill/Patch Count:  12 of 60 pills remain Pill/Patch Appearance: Markings consistent with prescribed medication Bottle Appearance: Standard pharmacy container. Clearly labeled. Filled Date: 08 / 27 / 2022 Last Medication intake:  Today  Medication #2: Hydrocodone/APAP Pill/Patch Count:  16 of 45 pills remain Pill/Patch Appearance: Markings consistent with prescribed medication Bottle Appearance: Standard pharmacy container. Clearly labeled. Filled Date: 09 / 28 /  2022 Last Medication intake:  Today

## 2021-04-17 DIAGNOSIS — E039 Hypothyroidism, unspecified: Secondary | ICD-10-CM | POA: Diagnosis not present

## 2021-04-17 DIAGNOSIS — N183 Chronic kidney disease, stage 3 unspecified: Secondary | ICD-10-CM | POA: Diagnosis not present

## 2021-04-17 DIAGNOSIS — E1122 Type 2 diabetes mellitus with diabetic chronic kidney disease: Secondary | ICD-10-CM | POA: Diagnosis not present

## 2021-04-17 DIAGNOSIS — E782 Mixed hyperlipidemia: Secondary | ICD-10-CM | POA: Diagnosis not present

## 2021-04-24 DIAGNOSIS — Z6837 Body mass index (BMI) 37.0-37.9, adult: Secondary | ICD-10-CM | POA: Diagnosis not present

## 2021-04-24 DIAGNOSIS — Z0000 Encounter for general adult medical examination without abnormal findings: Secondary | ICD-10-CM | POA: Diagnosis not present

## 2021-04-24 DIAGNOSIS — E782 Mixed hyperlipidemia: Secondary | ICD-10-CM | POA: Diagnosis not present

## 2021-04-24 DIAGNOSIS — E119 Type 2 diabetes mellitus without complications: Secondary | ICD-10-CM | POA: Diagnosis not present

## 2021-04-24 DIAGNOSIS — E1121 Type 2 diabetes mellitus with diabetic nephropathy: Secondary | ICD-10-CM | POA: Diagnosis not present

## 2021-04-24 DIAGNOSIS — Z1389 Encounter for screening for other disorder: Secondary | ICD-10-CM | POA: Diagnosis not present

## 2021-05-07 ENCOUNTER — Other Ambulatory Visit: Payer: Self-pay

## 2021-05-07 ENCOUNTER — Encounter: Payer: Self-pay | Admitting: Student in an Organized Health Care Education/Training Program

## 2021-05-07 ENCOUNTER — Ambulatory Visit
Payer: Medicare HMO | Attending: Student in an Organized Health Care Education/Training Program | Admitting: Student in an Organized Health Care Education/Training Program

## 2021-05-07 VITALS — BP 133/65 | HR 76 | Temp 97.9°F | Resp 15 | Ht <= 58 in | Wt 183.0 lb

## 2021-05-07 DIAGNOSIS — Z0289 Encounter for other administrative examinations: Secondary | ICD-10-CM | POA: Diagnosis not present

## 2021-05-07 DIAGNOSIS — M961 Postlaminectomy syndrome, not elsewhere classified: Secondary | ICD-10-CM | POA: Insufficient documentation

## 2021-05-07 DIAGNOSIS — M47817 Spondylosis without myelopathy or radiculopathy, lumbosacral region: Secondary | ICD-10-CM | POA: Insufficient documentation

## 2021-05-07 DIAGNOSIS — G894 Chronic pain syndrome: Secondary | ICD-10-CM | POA: Insufficient documentation

## 2021-05-07 DIAGNOSIS — Z981 Arthrodesis status: Secondary | ICD-10-CM | POA: Insufficient documentation

## 2021-05-07 MED ORDER — TOPIRAMATE 50 MG PO TABS: 50.0000 mg | ORAL_TABLET | Freq: Two times a day (BID) | ORAL | 11 refills | Status: DC

## 2021-05-07 MED ORDER — HYDROCODONE-ACETAMINOPHEN 5-325 MG PO TABS: 1.0000 | ORAL_TABLET | Freq: Every day | ORAL | 0 refills | Status: AC | PRN

## 2021-05-07 MED ORDER — TRAMADOL HCL 50 MG PO TABS: 50.0000 mg | ORAL_TABLET | Freq: Two times a day (BID) | ORAL | 2 refills | Status: DC | PRN

## 2021-05-07 MED ORDER — GABAPENTIN 300 MG PO CAPS: 300.0000 mg | ORAL_CAPSULE | Freq: Two times a day (BID) | ORAL | 11 refills | Status: DC

## 2021-05-07 MED ORDER — HYDROCODONE-ACETAMINOPHEN 5-325 MG PO TABS: 1.0000 | ORAL_TABLET | Freq: Every day | ORAL | 0 refills | Status: DC | PRN

## 2021-05-07 NOTE — Progress Notes (Signed)
PROVIDER NOTE: Information contained herein reflects review and annotations entered in association with encounter. Interpretation of such information and data should be left to medically-trained personnel. Information provided to patient can be located elsewhere in the medical record under "Patient Instructions". Document created using STT-dictation technology, any transcriptional errors that may result from process are unintentional.    Patient: Cynthia Dawson  Service Category: E/M  Provider: Gillis Santa, MD  DOB: July 12, 1945  DOS: 05/07/2021  Specialty: Interventional Pain Management  MRN: [id number]  Setting: Ambulatory outpatient  PCP: Dion Body, MD  Type: Established Patient    Referring Provider: Dion Body, MD  Location: Office  Delivery: Face-to-face     Primary Reason(s) for Visit: Encounter for evaluation before starting new chronic pain management plan of care (Level of risk: moderate) CC: Back Pain (lower)   HPI  Cynthia Dawson is a 76 y.o. year old, female patient, who comes today for a follow-up evaluation to review the test results and decide on a treatment plan. She has Chronic back pain; Chronic pain syndrome; CKD (chronic kidney disease) stage 3, GFR 30-59 ml/min (HCC); Lumbosacral spondylosis without myelopathy; Type 2 diabetes mellitus with kidney complication, without long-term current use of insulin ([place]); Postlaminectomy syndrome of lumbosacral region; Status post lumbar spinal fusion (T10-L4); Status post thoracic spinal fusion (T10-L4); and Pain management contract signed on their problem list. Her primarily concern today is the Back Pain (lower)   Pain Assessment: Location: Lower Back Radiating: denies Onset: More than a month ago Duration: Chronic pain Quality: Aching Severity: 7 /10 (subjective, self-reported pain score)  Effect on ADL: limits daily activities; cannot stand long or walk far Timing: Constant Modifying factors: meds BP: 133/65   HR:  76  Has been dealing with right eye diplopia secondary to glaucoma.  Has an upcoming appointment with ophthalmologist later this month.  Otherwise, patient's pain is at baseline.  Patient continues multimodal pain regimen as prescribed.  States that it provides pain relief and improvement in functional status.  No falls since her last visit with me.   12/04/20: Patient states that she had COVID approximately 2 weeks ago.  She had increased congestion as well as cervical and shoulder pain.  She states that she has recovered and is doing better.  Otherwise she presents today for refill of her chronic pain regimen.  She continues gabapentin and has no refills at her pharmacy.  Today we will refill her Norco and tramadol.  No change in dose.  Patient is compliant with therapy.  Endorses analgesic response and improvement in functional status with intake of medications.   10/11/2020: No change in her medical history.  UDS up-to-date and appropriate.  I will have the patient sign pain contract and take over her chronic pain regimen as above.  No change in dose.  Patient was very appreciative that she is able to establish year as this is much closer to her home.  See HPI from 07/16/20  Cynthia Dawson is a pleasant 76 year old female with a history of thoracolumbar fusion, T10-L4 that was done May 2015 for severe scoliosis and spinal stenosis.  Patient has been receiving her chronic pain management care at [facility] pain.  She states that it is very far for her and that her daughter has to take off of work to take her to her visits.  She is hoping to have her care transferred here which is closer to her home.  She would be able to drive to her appointments here by herself  without having her daughter accompany her which would really help her out a lot she states.  I have reviewed clinic notes from her visits at [facility] pain medicine.  Patient's medication regimen is very reasonable and she has been compliant with therapy.   Current  Pain Medication Regimen: - Norco 5/325 mg BID (never takes more than 2x a day) - Tramadol 50 mg BID (never takes more than 2x per day) - Gabapentin 322m BID  - Topamax 50 mg BID - Voltaren gel   Her last visit with [facility] pain medicine was 07/09/2020 where she renewed her pain contract and submitted a urine tox screen for annual medication compliance and monitoring.  There have been no concerns regarding her medication intake or medication history since she has been [facility] with [facility]     Pharmacotherapy Assessment  Analgesic:  - Norco 5/325 mg BID (never takes more than 2x a day) - Tramadol 50 mg BID (never takes more than 2x per day)  Monitoring: [place] PMP: PDMP reviewed during this encounter.       Pharmacotherapy: No side-effects or adverse reactions reported. Compliance: No problems identified. Effectiveness: Clinically acceptable.  UDS:  Summary  Date Value Ref Range Status  07/16/2020 Note  Final    Comment:    ==================================================================== Compliance Drug Analysis, Ur ==================================================================== Test                             Result       Flag       Units  Drug Present and Declared for Prescription Verification   Norhydrocodone                 163          EXPECTED   ng/mg creat    Norhydrocodone is an expected metabolite of hydrocodone.    Tramadol                       6948         EXPECTED   ng/mg creat   O-Desmethyltramadol            >7463        EXPECTED   ng/mg creat   N-Desmethyltramadol            618          EXPECTED   ng/mg creat    Source of tramadol is a prescription medication. O-desmethyltramadol    and N-desmethyltramadol are expected metabolites of tramadol.    Gabapentin                     PRESENT      EXPECTED   Topiramate                     PRESENT      EXPECTED   Acetaminophen                  PRESENT      EXPECTED   Metoprolol                     PRESENT       EXPECTED  Drug Present not Declared for Prescription Verification   Ephedrine/Pseudoephedrine      PRESENT      UNEXPECTED   Phenylpropanolamine            PRESENT  UNEXPECTED    Source of ephedrine/pseudoephedrine is most commonly pseudoephedrine    in over-the-counter or prescription cold and allergy medications.    Phenylpropanolamine is an expected metabolite of    ephedrine/pseudoephedrine.  Drug Absent but Declared for Prescription Verification   Hydrocodone                    Not Detected UNEXPECTED ng/mg creat    Hydrocodone is almost always present in patients taking this drug    consistently. Absence of hydrocodone could be due to lapse of time    since the last dose or unusual pharmacokinetics (rapid metabolism).    Diclofenac                     Not Detected UNEXPECTED    Topical diclofenac, as indicated in the declared medication list, is    not always detected even when used as directed.    Salicylate                     Not Detected UNEXPECTED    Aspirin, as indicated in the declared medication list, is not always    detected even when used as directed.  ==================================================================== Test                      Result    Flag   Units      Ref Range   Creatinine              67               mg/dL      >=20 ==================================================================== Declared Medications:  The flagging and interpretation on this report are based on the  following declared medications.  Unexpected results may arise from  inaccuracies in the declared medications.   **Note: The testing scope of this panel includes these medications:   Gabapentin (Neurontin)  Hydrocodone (Norco)  Metoprolol  Topiramate (Topamax)  Tramadol (Ultram)   **Note: The testing scope of this panel does not include small to  moderate amounts of these reported medications:   Acetaminophen (Norco)  Aspirin  Topical Diclofenac (Voltaren)    **Note: The testing scope of this panel does not include the  following reported medications:   Atorvastatin (Lipitor)  Calcium  Fenofibrate (TriCor)  Levothyroxine (Synthroid)  Lisinopril (Zestril)  Metformin (Glucophage)  Nystatin (Mycostatin)  Vitamin D3 ==================================================================== For clinical consultation, please call [phone]. ====================================================================       Laboratory Chemistry Profile   Renal Lab Results  Component Value Date   PROTEINUR 100 mg/dL 09/28/2013     Electrolytes No results found for: NA, K, CL, CALCIUM, MG, PHOS   Hepatic No results found for: AST, ALT, ALBUMIN, ALKPHOS, AMYLASE, LIPASE, AMMONIA   ID Lab Results  Component Value Date   [place] NEGATIVE 02/25/2019     Bone No results found for: VD25OH, [id number], [id number], XK4818HU3, 25OHVITD1, 25OHVITD2, 25OHVITD3, TESTOFREE, TESTOSTERONE   Endocrine Lab Results  Component Value Date   GLUCOSEU Negative 09/28/2013   HGBA1C 5.5 09/06/2013     Neuropathy Lab Results  Component Value Date   HGBA1C 5.5 09/06/2013     CNS No results found for: COLORCSF, APPEARCSF, RBCCOUNTCSF, WBCCSF, POLYSCSF, LYMPHSCSF, EOSCSF, PROTEINCSF, GLUCCSF, JCVIRUS, CSFOLI, IGGCSF, LABACHR, ACETBL, LABACHR, ACETBL   Inflammation (CRP: Acute   ESR: Chronic) No results found for: CRP, ESRSEDRATE, LATICACIDVEN   Rheumatology No results found for: RF, ANA, LABURIC, URICUR, LYMEIGGIGMAB,  LYMEABIGMQN, HLAB27   Coagulation Lab Results  Component Value Date   PLT 446 (H) 09/08/2013     Cardiovascular Lab Results  Component Value Date   HGB 8.9 (L) 09/08/2013   HCT 27.2 (L) 09/08/2013     Screening Lab Results  Component Value Date   SARSCOV2NAA NEGATIVE 02/25/2019     Cancer No results found for: CEA, CA125, LABCA2   Allergens No results found for: ALMOND, APPLE, ASPARAGUS, AVOCADO, BANANA, BARLEY, BASIL,  BAYLEAF, GREENBEAN, LIMABEAN, WHITEBEAN, BEEFIGE, REDBEET, BLUEBERRY, BROCCOLI, CABBAGE, MELON, CARROT, CASEIN, CASHEWNUT, CAULIFLOWER, CELERY     Note: Lab results reviewed.   Meds   Current Outpatient Medications:    aspirin EC 81 MG tablet, Take 81 mg by mouth daily., Disp: , Rfl:    atorvastatin (LIPITOR) 20 MG tablet, Take 20 mg by mouth daily., Disp: , Rfl:    Calcium Carbonate-Vit D-Min (CALCIUM 1200 PO), Take by mouth., Disp: , Rfl:    Cholecalciferol (VITAMIN D3) 125 MCG (5000 UT) CAPS, Take by mouth., Disp: , Rfl:    diclofenac sodium (VOLTAREN) 1 % GEL, Apply topically as needed., Disp: , Rfl:    fenofibrate (TRICOR) 145 MG tablet, Take 145 mg by mouth daily., Disp: , Rfl:    levothyroxine (SYNTHROID, LEVOTHROID) 75 MCG tablet, Take 75 mcg by mouth daily before breakfast., Disp: , Rfl:    lisinopril (PRINIVIL,ZESTRIL) 2.5 MG tablet, Take 2.5 mg by mouth daily., Disp: , Rfl:    metFORMIN (GLUCOPHAGE) 1000 MG tablet, Take 500 mg by mouth daily with breakfast., Disp: , Rfl:    METOPROLOL TARTRATE PO, Take 25 mg by mouth daily., Disp: , Rfl:    nystatin cream (MYCOSTATIN), Apply 1 application topically as needed for dry skin., Disp: , Rfl:    gabapentin (NEURONTIN) 300 MG capsule, Take 1 capsule (300 mg total) by mouth 2 (two) times daily., Disp: 60 capsule, Rfl: 11   [START ON 05/16/2021] HYDROcodone-acetaminophen (NORCO/VICODIN) 5-325 MG tablet, Take 1-2 tablets by mouth daily as needed for moderate pain. For chronic pain syndrome, Disp: 45 tablet, Rfl: 0   [START ON 06/15/2021] HYDROcodone-acetaminophen (NORCO/VICODIN) 5-325 MG tablet, Take 1-2 tablets by mouth daily as needed for moderate pain. For chronic pain syndrome, Disp: 45 tablet, Rfl: 0   [START ON 07/15/2021] HYDROcodone-acetaminophen (NORCO/VICODIN) 5-325 MG tablet, Take 1-2 tablets by mouth daily as needed for moderate pain. For chronic pain syndrome, Disp: 45 tablet, Rfl: 0   topiramate (TOPAMAX) 50 MG tablet, Take 1 tablet  (50 mg total) by mouth 2 (two) times daily., Disp: 60 tablet, Rfl: 11   traMADol (ULTRAM) 50 MG tablet, Take 1 tablet (50 mg total) by mouth every 12 (twelve) hours as needed. For chronic pain, Disp: 60 tablet, Rfl: 2  ROS  Constitutional: Denies any fever or chills Gastrointestinal: No reported hemesis, hematochezia, vomiting, or acute GI distress Musculoskeletal: Denies any acute onset joint swelling, redness, loss of ROM, or weakness Neurological: No reported episodes of acute onset apraxia, aphasia, dysarthria, agnosia, amnesia, paralysis, loss of coordination, or loss of consciousness  Allergies  Cynthia Dawson is allergic to aspartame and phenylalanine, penicillins, and adhesive [tape].  PFSH  Drug: Cynthia Dawson  reports no history of drug use. Alcohol:  reports no history of alcohol use. Tobacco:  reports that she has never smoked. She has never used smokeless tobacco. Medical:  has a past medical history of Arthritis, Diabetes mellitus without complication ([place]), Heart murmur, Hypercholesteremia, Hypertension, Hypothyroidism, and Neuropathy. Surgical: Cynthia Dawson  has a  past surgical history that includes Back surgery; Cesarean section; Cholecystectomy; Abdominal hysterectomy; Cataract extraction w/PHACO (Right, 07/03/2015); Eye surgery; Colonoscopy with propofol (N/A, 06/06/2016); and Cataract extraction w/PHACO (Left, 03/01/2019). Family: family history includes Breast cancer in her paternal aunt.  Constitutional Exam  General appearance: Well nourished, well developed, and well hydrated. In no apparent acute distress Vitals:   05/07/21 1357  BP: 133/65  Pulse: 76  Resp: 15  Temp: 97.9 F (36.6 C)  TempSrc: Temporal  SpO2: 97%  Weight: 183 lb (83 kg)  Height: '4\' 10"'  (1.473 m)   BMI Assessment: Estimated body mass index is 38.25 kg/m as calculated from the following:   Height as of this encounter: '4\' 10"'  (1.473 m).   Weight as of this encounter: 183 lb (83 kg).  BMI interpretation  table: BMI level Category Range association with higher incidence of chronic pain  <18 kg/m2 Underweight   18.5-24.9 kg/m2 Ideal body weight   25-29.9 kg/m2 Overweight Increased incidence by 20%  30-34.9 kg/m2 Obese (Class I) Increased incidence by 68%  35-39.9 kg/m2 Severe obesity (Class II) Increased incidence by 136%  >40 kg/m2 Extreme obesity (Class III) Increased incidence by 254%   Patient's current BMI Ideal Body weight  Body mass index is 38.25 kg/m. Patient must be at least 60 in tall to calculate ideal body weight   BMI Readings from Last 4 Encounters:  05/07/21 38.25 kg/m  02/07/21 38.25 kg/m  12/04/20 38.25 kg/m  10/11/20 38.25 kg/m   Wt Readings from Last 4 Encounters:  05/07/21 183 lb (83 kg)  02/07/21 183 lb (83 kg)  12/04/20 183 lb (83 kg)  10/11/20 183 lb (83 kg)    Psych/Mental status: Alert, oriented x 3 (person, place, & time)       Eyes: PERLA Respiratory: No evidence of acute respiratory distress  Thoracic Spine Area Exam  Skin & Axial Inspection: Well healed scar from previous spine surgery detected Alignment: Asymmetric Functional ROM: Mechanically restricted ROM Stability: No instability detected Muscle Tone/Strength: Functionally intact. No obvious neuro-muscular anomalies detected. Sensory (Neurological): Dermatomal pain pattern   Lumbar Exam  Skin & Axial Inspection: Well healed scar from previous spine surgery detected Alignment: Asymmetric Functional ROM: Mechanically restricted ROM       Stability: No instability detected Muscle Tone/Strength: Functionally intact. No obvious neuro-muscular anomalies detected. Sensory (Neurological): Dermatomal pain pattern right hip/leg   Gait & Posture Assessment  Ambulation: Unassisted Gait: Antalgic Posture: Difficulty standing up straight, due to pain    Lower Extremity Exam      Side: Right lower extremity   Side: Left lower extremity  Stability: No instability observed           Stability:  No instability observed          Skin & Extremity Inspection: Skin color, temperature, and hair growth are WNL. No peripheral edema or cyanosis. No masses, redness, swelling, asymmetry, or associated skin lesions. No contractures.   Skin & Extremity Inspection: Skin color, temperature, and hair growth are WNL. No peripheral edema or cyanosis. No masses, redness, swelling, asymmetry, or associated skin lesions. No contractures.  Functional ROM: Pain restricted ROM for hip and knee joints           Functional ROM: Unrestricted ROM                  Muscle Tone/Strength: Functionally intact. No obvious neuro-muscular anomalies detected.   Muscle Tone/Strength: Functionally intact. No obvious neuro-muscular anomalies detected.  Sensory (Neurological): Dermatomal  pain pattern medial portion of foot (L4)   Sensory (Neurological): Unimpaired        DTR: Patellar: deferred today Achilles: deferred today Plantar: deferred today   DTR: Patellar: deferred today Achilles: deferred today Plantar: deferred today  Palpation: No palpable anomalies   Palpation: No palpable anomalies    Assessment & Plan  Primary Diagnosis & Pertinent Problem List: The primary encounter diagnosis was Chronic pain syndrome. Diagnoses of Lumbosacral spondylosis without myelopathy, Status post lumbar spinal fusion (T10-L4), Postlaminectomy syndrome of lumbosacral region, Status post thoracic spinal fusion (T10-L4), and Pain management contract signed were also pertinent to this visit.  Visit Diagnosis: 1. Chronic pain syndrome   2. Lumbosacral spondylosis without myelopathy   3. Status post lumbar spinal fusion (T10-L4)   4. Postlaminectomy syndrome of lumbosacral region   5. Status post thoracic spinal fusion (T10-L4)   6. Pain management contract signed       Plan of Care  Pharmacotherapy (Medications Ordered): Meds ordered this encounter  Medications   traMADol (ULTRAM) 50 MG tablet    Sig: Take 1 tablet (50 mg  total) by mouth every 12 (twelve) hours as needed. For chronic pain    Dispense:  60 tablet    Refill:  2   HYDROcodone-acetaminophen (NORCO/VICODIN) 5-325 MG tablet    Sig: Take 1-2 tablets by mouth daily as needed for moderate pain. For chronic pain syndrome    Dispense:  45 tablet    Refill:  0   HYDROcodone-acetaminophen (NORCO/VICODIN) 5-325 MG tablet    Sig: Take 1-2 tablets by mouth daily as needed for moderate pain. For chronic pain syndrome    Dispense:  45 tablet    Refill:  0   HYDROcodone-acetaminophen (NORCO/VICODIN) 5-325 MG tablet    Sig: Take 1-2 tablets by mouth daily as needed for moderate pain. For chronic pain syndrome    Dispense:  45 tablet    Refill:  0   gabapentin (NEURONTIN) 300 MG capsule    Sig: Take 1 capsule (300 mg total) by mouth 2 (two) times daily.    Dispense:  60 capsule    Refill:  11   topiramate (TOPAMAX) 50 MG tablet    Sig: Take 1 tablet (50 mg total) by mouth 2 (two) times daily.    Dispense:  60 tablet    Refill:  11      Provider-requested follow-up: Return in about 3 months (around 08/05/2021) for Medication Management, in person. Recent Visits Date Type Provider Dept  02/07/21 Office Visit Gillis Santa, MD [facility]  Showing recent visits within past 90 days and meeting all other requirements Today's Visits Date Type Provider Dept  05/07/21 Office Visit Gillis Santa, MD [facility]  Showing today's visits and meeting all other requirements Future Appointments Date Type Provider Dept  08/01/21 Appointment Gillis Santa, MD [facility]  Showing future appointments within next 90 days and meeting all other requirements  Primary Care Physician: Dion Body, MD Note by: Gillis Santa, MD Date: 05/07/2021; Time: 2:40 PM

## 2021-05-07 NOTE — Progress Notes (Signed)
Nursing Pain Medication Assessment:  Safety precautions to be maintained throughout the outpatient stay will include: orient to surroundings, keep bed in low position, maintain call bell within reach at all times, provide assistance with transfer out of bed and ambulation.  Medication Inspection Compliance: Pill count conducted under aseptic conditions, in front of the patient. Neither the pills nor the bottle was removed from the patient's sight at any time. Once count was completed pills were immediately returned to the patient in their original bottle.  Medication #1: Tramadol (Ultram) Pill/Patch Count:  16 of 60 pills remain Pill/Patch Appearance: Markings consistent with prescribed medication Bottle Appearance: Standard pharmacy container. Clearly labeled. Filled Date: 42 / 46 / 2022 Last Medication intake:  Today  Medication #2: Hydrocodone/APAP Pill/Patch Count:  20 of 45 pills remain Pill/Patch Appearance: Markings consistent with prescribed medication Bottle Appearance: Standard pharmacy container. Clearly labeled. Filled Date: 54 / 27 /  2022 Last Medication intake:  Yesterday

## 2021-05-21 DIAGNOSIS — H47322 Drusen of optic disc, left eye: Secondary | ICD-10-CM | POA: Diagnosis not present

## 2021-05-21 DIAGNOSIS — H524 Presbyopia: Secondary | ICD-10-CM | POA: Diagnosis not present

## 2021-05-31 DIAGNOSIS — H524 Presbyopia: Secondary | ICD-10-CM | POA: Diagnosis not present

## 2021-08-01 ENCOUNTER — Other Ambulatory Visit: Payer: Self-pay

## 2021-08-01 ENCOUNTER — Ambulatory Visit
Payer: Medicare HMO | Attending: Student in an Organized Health Care Education/Training Program | Admitting: Student in an Organized Health Care Education/Training Program

## 2021-08-01 ENCOUNTER — Encounter: Payer: Self-pay | Admitting: Student in an Organized Health Care Education/Training Program

## 2021-08-01 VITALS — BP 127/51 | HR 64 | Temp 97.3°F | Resp 16 | Ht 58.5 in | Wt 183.0 lb

## 2021-08-01 DIAGNOSIS — M549 Dorsalgia, unspecified: Secondary | ICD-10-CM | POA: Insufficient documentation

## 2021-08-01 DIAGNOSIS — Z981 Arthrodesis status: Secondary | ICD-10-CM | POA: Diagnosis not present

## 2021-08-01 DIAGNOSIS — G8929 Other chronic pain: Secondary | ICD-10-CM | POA: Insufficient documentation

## 2021-08-01 DIAGNOSIS — M961 Postlaminectomy syndrome, not elsewhere classified: Secondary | ICD-10-CM | POA: Diagnosis not present

## 2021-08-01 DIAGNOSIS — G894 Chronic pain syndrome: Secondary | ICD-10-CM | POA: Diagnosis not present

## 2021-08-01 DIAGNOSIS — Z0289 Encounter for other administrative examinations: Secondary | ICD-10-CM | POA: Insufficient documentation

## 2021-08-01 DIAGNOSIS — M47817 Spondylosis without myelopathy or radiculopathy, lumbosacral region: Secondary | ICD-10-CM | POA: Diagnosis not present

## 2021-08-01 MED ORDER — HYDROCODONE-ACETAMINOPHEN 5-325 MG PO TABS: 1.0000 | ORAL_TABLET | Freq: Every day | ORAL | 0 refills | Status: AC | PRN

## 2021-08-01 MED ORDER — TRAMADOL HCL 50 MG PO TABS: 50.0000 mg | ORAL_TABLET | Freq: Two times a day (BID) | ORAL | 2 refills | Status: DC | PRN

## 2021-08-01 MED ORDER — HYDROCODONE-ACETAMINOPHEN 5-325 MG PO TABS: 1.0000 | ORAL_TABLET | Freq: Every day | ORAL | 0 refills | Status: DC | PRN

## 2021-08-01 NOTE — Progress Notes (Signed)
Nursing Pain Medication Assessment:  ?Safety precautions to be maintained throughout the outpatient stay will include: orient to surroundings, keep bed in low position, maintain call bell within reach at all times, provide assistance with transfer out of bed and ambulation.  ?Medication Inspection Compliance: Pill count conducted under aseptic conditions, in front of the patient. Neither the pills nor the bottle was removed from the patient's sight at any time. Once count was completed pills were immediately returned to the patient in their original bottle. ? ?Medication #1: Tramadol (Ultram) ?Pill/Patch Count:  40 of 60 pills remain ?Pill/Patch Appearance: Markings consistent with prescribed medication ?Bottle Appearance: Standard pharmacy container. Clearly labeled. ?Filled Date: 03 / 30 / 2023 ?Last Medication intake:  Today ? ?Medication #2: Hydrocodone/APAP ?Pill/Patch Count:  23 of 45 pills remain ?Pill/Patch Appearance: Markings consistent with prescribed medication ?Bottle Appearance: Standard pharmacy container. Clearly labeled. ?Filled Date: 03 / 28 / 2023 ?Last Medication intake:  Yesterday ?

## 2021-08-01 NOTE — Progress Notes (Signed)
PROVIDER NOTE: Information contained herein reflects review and annotations entered in association with encounter. Interpretation of such information and data should be left to medically-trained personnel. Information provided to patient can be located elsewhere in the medical record under "Patient Instructions". Document created using STT-dictation technology, any transcriptional errors that may result from process are unintentional.  ?  ?Patient: Cynthia Dawson  Service Category: E/M  Provider: Gillis Santa, MD  ?DOB: 1945/12/11  DOS: 08/01/2021  Specialty: Interventional Pain Management  ?MRN: [id number]  Setting: Ambulatory outpatient  PCP: Dion Body, MD  ?Type: Established Patient    Referring Provider: Dion Body, MD  ?Location: Office  Delivery: Face-to-face    ? ?HPI  ?[name], a 76 y.o. year old female, is here today because of her Chronic pain syndrome [G89.4]. [name]'s primary complain today is Back Pain (Thoracic and lumbar bilateral ) ?Last encounter: My last encounter with her was on 05/07/2021. ?Pertinent problems: [name] has Chronic back pain; Chronic pain syndrome; CKD (chronic kidney disease) stage 3, GFR 30-59 ml/min (Cynthia Dawson); Lumbosacral spondylosis without myelopathy; Type 2 diabetes mellitus with kidney complication, without long-term current use of insulin (Cynthia Dawson); Postlaminectomy syndrome of lumbosacral region; Status post lumbar spinal fusion (T10-L4); Status post thoracic spinal fusion (T10-L4); and Pain management contract signed on their pertinent problem list. ?Pain Assessment: Severity of Chronic pain is reported as a 6 /10. Location: Back Mid, Lower, Left, Right/into the right hip. Onset: More than a month ago. Quality: Discomfort, Constant, Aching. Timing: Constant. Modifying factor(s): laying flat, staying off her feet. elevating leg.  medications.Marland Kitchen ?Vitals:  height is 4' 10.5" (1.486 m) and weight is 183 lb (83 kg). Her temporal temperature is 97.3 ?F (36.3  ?C) (abnormal). Her blood pressure is 127/51 (abnormal) and her pulse is 64. Her respiration is 16 and oxygen saturation is 97%.  ? ?Reason for encounter:  ? ?No change in medical history since last visit.  Patient's pain is at baseline.  Patient continues multimodal pain regimen as prescribed.  States that it provides pain relief and improvement in functional status. ? ? ?Pharmacotherapy Assessment  ?Analgesic:  - Norco 5/325 mg BID (never takes more than 2x a day) ?- Tramadol 50 mg BID (never takes more than 2x per day)     ? ?Monitoring: ?[place] PMP: PDMP not reviewed this encounter.       ?Pharmacotherapy: No side-effects or adverse reactions reported. ?Compliance: No problems identified. ?Effectiveness: Clinically acceptable. ? ?Janett Billow, RN  08/01/2021  1:40 PM  Sign when Signing Visit ?Nursing Pain Medication Assessment:  ?Safety precautions to be maintained throughout the outpatient stay will include: orient to surroundings, keep bed in low position, maintain call bell within reach at all times, provide assistance with transfer out of bed and ambulation.  ?Medication Inspection Compliance: Pill count conducted under aseptic conditions, in front of the patient. Neither the pills nor the bottle was removed from the patient's sight at any time. Once count was completed pills were immediately returned to the patient in their original bottle. ? ?Medication #1: Tramadol (Ultram) ?Pill/Patch Count:  40 of 60 pills remain ?Pill/Patch Appearance: Markings consistent with prescribed medication ?Bottle Appearance: Standard pharmacy container. Clearly labeled. ?Filled Date: 03 / 30 / 2023 ?Last Medication intake:  Today ? ?Medication #2: Hydrocodone/APAP ?Pill/Patch Count:  23 of 45 pills remain ?Pill/Patch Appearance: Markings consistent with prescribed medication ?Bottle Appearance: Standard pharmacy container. Clearly labeled. ?Filled Date: 03 / 28 / 2023 ?Last Medication intake:  Yesterday ?  UDS:  ?Summary   ?Date Value Ref Range Status  ?07/16/2020 Note  Final  ?  Comment:  ?  ==================================================================== ?Compliance Drug Analysis, Ur ?==================================================================== ?Test                             Result       Flag       Units ? ?Drug Present and Declared for Prescription Verification ?  Norhydrocodone                 163          EXPECTED   ng/mg creat ?   Norhydrocodone is an expected metabolite of hydrocodone. ? ?  Tramadol                       6948         EXPECTED   ng/mg creat ?  O-Desmethyltramadol            >7463        EXPECTED   ng/mg creat ?  N-Desmethyltramadol            618          EXPECTED   ng/mg creat ?   Source of tramadol is a prescription medication. O-desmethyltramadol ?   and N-desmethyltramadol are expected metabolites of tramadol. ? ?  Gabapentin                     PRESENT      EXPECTED ?  Topiramate                     PRESENT      EXPECTED ?  Acetaminophen                  PRESENT      EXPECTED ?  Metoprolol                     PRESENT      EXPECTED ? ?Drug Present not Declared for Prescription Verification ?  Ephedrine/Pseudoephedrine      PRESENT      UNEXPECTED ?  Phenylpropanolamine            PRESENT      UNEXPECTED ?   Source of ephedrine/pseudoephedrine is most commonly pseudoephedrine ?   in over-the-counter or prescription cold and allergy medications. ?   Phenylpropanolamine is an expected metabolite of ?   ephedrine/pseudoephedrine. ? ?Drug Absent but Declared for Prescription Verification ?  Hydrocodone                    Not Detected UNEXPECTED ng/mg creat ?   Hydrocodone is almost always present in patients taking this drug ?   consistently. Absence of hydrocodone could be due to lapse of time ?   since the last dose or unusual pharmacokinetics (rapid metabolism). ? ?  Diclofenac                     Not Detected UNEXPECTED ?   Topical diclofenac, as indicated in the declared medication list, is ?    not always detected even when used as directed. ? ?  Salicylate                     Not Detected UNEXPECTED ?   Aspirin, as indicated in  the declared medication list, is not always ?   detected even when used as directed. ? ?==================================================================== ?Test                      Result    Flag   Units      Ref Range ?  Creatinine              67               mg/dL      >=20 ?==================================================================== ?Declared Medications: ? The flagging and interpretation on this report are based on the ? following declared medications.  Unexpected results may arise from ? inaccuracies in the declared medications. ? ? **Note: The testing scope of this panel includes these medications: ? ? Gabapentin (Neurontin) ? Hydrocodone (Norco) ? Metoprolol ? Topiramate (Topamax) ? Tramadol (Ultram) ? ? **Note: The testing scope of this panel does not include small to ? moderate amounts of these reported medications: ? ? Acetaminophen (Norco) ? Aspirin ? Topical Diclofenac (Voltaren) ? ? **Note: The testing scope of this panel does not include the ? following reported medications: ? ? Atorvastatin (Lipitor) ? Calcium ? Fenofibrate (TriCor) ? Levothyroxine (Synthroid) ? Lisinopril (Zestril) ? Metformin (Glucophage) ? Nystatin (Mycostatin) ? Vitamin D3 ?==================================================================== ?For clinical consultation, please call [phone]. ?==================================================================== ?  ?  ? ?ROS  ?Constitutional: Denies any fever or chills ?Gastrointestinal: No reported hemesis, hematochezia, vomiting, or acute GI distress ?Musculoskeletal:  Low back and right hip pain ?Neurological: No reported episodes of acute onset apraxia, aphasia, dysarthria, agnosia, amnesia, paralysis, loss of coordination, or loss of consciousness ? ?Medication Review  ?Calcium Carbonate-Vit D-Min,  HYDROcodone-acetaminophen, Vitamin D3, aspirin EC, atorvastatin, diclofenac sodium, fenofibrate, gabapentin, levothyroxine, lisinopril, metFORMIN, metoprolol tartrate, nystatin cream, topiramate, and traMADol ? ?History Revie

## 2021-08-07 LAB — TOXASSURE SELECT 13 (MW), URINE

## 2021-09-18 DIAGNOSIS — H47322 Drusen of optic disc, left eye: Secondary | ICD-10-CM | POA: Diagnosis not present

## 2021-10-17 DIAGNOSIS — I10 Essential (primary) hypertension: Secondary | ICD-10-CM | POA: Diagnosis not present

## 2021-10-17 DIAGNOSIS — E039 Hypothyroidism, unspecified: Secondary | ICD-10-CM | POA: Diagnosis not present

## 2021-10-17 DIAGNOSIS — N1831 Chronic kidney disease, stage 3a: Secondary | ICD-10-CM | POA: Diagnosis not present

## 2021-10-17 DIAGNOSIS — E782 Mixed hyperlipidemia: Secondary | ICD-10-CM | POA: Diagnosis not present

## 2021-10-17 DIAGNOSIS — E1121 Type 2 diabetes mellitus with diabetic nephropathy: Secondary | ICD-10-CM | POA: Diagnosis not present

## 2021-10-24 DIAGNOSIS — Z6837 Body mass index (BMI) 37.0-37.9, adult: Secondary | ICD-10-CM | POA: Diagnosis not present

## 2021-10-24 DIAGNOSIS — N1831 Chronic kidney disease, stage 3a: Secondary | ICD-10-CM | POA: Diagnosis not present

## 2021-10-24 DIAGNOSIS — E782 Mixed hyperlipidemia: Secondary | ICD-10-CM | POA: Diagnosis not present

## 2021-10-24 DIAGNOSIS — E039 Hypothyroidism, unspecified: Secondary | ICD-10-CM | POA: Diagnosis not present

## 2021-10-24 DIAGNOSIS — I129 Hypertensive chronic kidney disease with stage 1 through stage 4 chronic kidney disease, or unspecified chronic kidney disease: Secondary | ICD-10-CM | POA: Diagnosis not present

## 2021-10-24 DIAGNOSIS — E1122 Type 2 diabetes mellitus with diabetic chronic kidney disease: Secondary | ICD-10-CM | POA: Diagnosis not present

## 2021-10-29 ENCOUNTER — Ambulatory Visit
Payer: Medicare HMO | Attending: Student in an Organized Health Care Education/Training Program | Admitting: Student in an Organized Health Care Education/Training Program

## 2021-10-29 ENCOUNTER — Encounter: Payer: Self-pay | Admitting: Student in an Organized Health Care Education/Training Program

## 2021-10-29 DIAGNOSIS — Z981 Arthrodesis status: Secondary | ICD-10-CM | POA: Diagnosis not present

## 2021-10-29 DIAGNOSIS — M961 Postlaminectomy syndrome, not elsewhere classified: Secondary | ICD-10-CM | POA: Insufficient documentation

## 2021-10-29 DIAGNOSIS — M47817 Spondylosis without myelopathy or radiculopathy, lumbosacral region: Secondary | ICD-10-CM | POA: Insufficient documentation

## 2021-10-29 DIAGNOSIS — G894 Chronic pain syndrome: Secondary | ICD-10-CM | POA: Insufficient documentation

## 2021-10-29 MED ORDER — HYDROCODONE-ACETAMINOPHEN 5-325 MG PO TABS: 1.0000 | ORAL_TABLET | Freq: Every day | ORAL | 0 refills | Status: AC | PRN

## 2021-10-29 MED ORDER — HYDROCODONE-ACETAMINOPHEN 5-325 MG PO TABS: 1.0000 | ORAL_TABLET | Freq: Every day | ORAL | 0 refills | Status: DC | PRN

## 2021-10-29 MED ORDER — TRAMADOL HCL 50 MG PO TABS: 50.0000 mg | ORAL_TABLET | Freq: Two times a day (BID) | ORAL | 2 refills | Status: DC | PRN

## 2021-10-29 NOTE — Progress Notes (Signed)
PROVIDER NOTE: Information contained herein reflects review and annotations entered in association with encounter. Interpretation of such information and data should be left to medically-trained personnel. Information provided to patient can be located elsewhere in the medical record under "Patient Instructions". Document created using STT-dictation technology, any transcriptional errors that may result from process are unintentional.    Patient: Cynthia Dawson  Service Category: E/M  Provider: Gillis Santa, MD  DOB: Aug 19, 1945  DOS: 10/29/2021  Specialty: Interventional Pain Management  MRN: [id number]  Setting: Ambulatory outpatient  PCP: Dion Body, MD  Type: Established Patient    Referring Provider: Dion Body, MD  Location: Office  Delivery: Face-to-face     HPI  Ms. CHELSE MATAS, a 76 y.o. year old female, is here today because of her No primary diagnosis found.. Ms. Gaunt's primary complain today is Back Pain (lower) Last encounter: My last encounter with her was on 08/01/2021 Pertinent problems: Ms. Frankland has Chronic back pain; Chronic pain syndrome; CKD (chronic kidney disease) stage 3, GFR 30-59 ml/min ([place]); Lumbosacral spondylosis without myelopathy; Type 2 diabetes mellitus with kidney complication, without long-term current use of insulin ([place]); Postlaminectomy syndrome of lumbosacral region; Status post lumbar spinal fusion (T10-L4); Status post thoracic spinal fusion (T10-L4); and Pain management contract signed on their pertinent problem list. Pain Assessment: Severity of Chronic pain is reported as a 7 /10. Location: Back Lower/down right leg to ankle. Onset: More than a month ago. Quality: Aching. Timing: Constant. Modifying factor(s): meds, reclining. Vitals:  height is '4\' 11"'  (1.499 m) and weight is 180 lb (81.6 kg). Her temperature is 97.3 F (36.3 C) (abnormal). Her blood pressure is 139/71 and her pulse is 74. Her respiration is 16 and oxygen saturation is 97%.    Reason for encounter:   No change in medical history since last visit.  Patient's pain is at baseline.  Patient continues multimodal pain regimen as prescribed.  States that it provides pain relief and improvement in functional status. She had a visit with her primary care provider where lab work was done.  Overall labs within normal limits, creatinine slightly elevated at 1.2.  Pharmacotherapy Assessment  Analgesic:  - Norco 5/325 mg BID (never takes more than 2x a day) - Tramadol 50 mg BID (never takes more than 2x per day)      Monitoring: [place] PMP: PDMP reviewed during this encounter.       Pharmacotherapy: No side-effects or adverse reactions reported. Compliance: No problems identified. Effectiveness: Clinically acceptable.  Rise Patience, RN  10/29/2021  2:40 PM  Sign when Signing Visit Nursing Pain Medication Assessment:  Safety precautions to be maintained throughout the outpatient stay will include: orient to surroundings, keep bed in low position, maintain call bell within reach at all times, provide assistance with transfer out of bed and ambulation.  Medication Inspection Compliance: Pill count conducted under aseptic conditions, in front of the patient. Neither the pills nor the bottle was removed from the patient's sight at any time. Once count was completed pills were immediately returned to the patient in their original bottle.  Medication #1: Tramadol (Ultram) Pill/Patch Count:  21 of 60 pills remain Pill/Patch Appearance: Markings consistent with prescribed medication Bottle Appearance: Standard pharmacy container. Clearly labeled. Filled Date: 06 / 08 / 2023 Last Medication intake:  Today  Medication #2: Hydrocodone/APAP Pill/Patch Count:  20 of 45 pills remain Pill/Patch Appearance: Markings consistent with prescribed medication Bottle Appearance: Standard pharmacy container. Clearly labeled. Filled Date: 06 / 26 /  2023 Last Medication intake:  Yesterday      UDS:  Summary  Date Value Ref Range Status  08/01/2021 Note  Final    Comment:    ==================================================================== [employer] [facility] (MW) ==================================================================== Test                             Result       Flag       Units  Drug Present and Declared for Prescription Verification   Hydromorphone                  65           EXPECTED   ng/mg creat   Norhydrocodone                 201          EXPECTED   ng/mg creat    Hydromorphone and norhydrocodone are expected metabolites of    hydrocodone. Sources of hydrocodone are scheduled prescription    medications.  Hydromorphone is also available as a scheduled    prescription medication.    Tramadol                       >6024        EXPECTED   ng/mg creat   O-Desmethyltramadol            >6024        EXPECTED   ng/mg creat   N-Desmethyltramadol            841          EXPECTED   ng/mg creat    Source of tramadol is a prescription medication. O-desmethyltramadol    and N-desmethyltramadol are expected metabolites of tramadol.  Drug Absent but Declared for Prescription Verification   Hydrocodone                    Not Detected UNEXPECTED ng/mg creat    Hydrocodone is almost always present in patients taking this drug    consistently. Absence of hydrocodone could be due to lapse of time    since the last dose or unusual pharmacokinetics (rapid metabolism).  ==================================================================== Test                      Result    Flag   Units      Ref Range   Creatinine              83               mg/dL      >=20 ==================================================================== Declared Medications:  The flagging and interpretation on this report are based on the  following declared medications.  Unexpected results may arise from  inaccuracies in the declared medications.   **Note: The testing scope of this panel  includes these medications:   Hydrocodone (Norco)  Tramadol (Ultram)   **Note: The testing scope of this panel does not include the  following reported medications:   Acetaminophen (Norco)  Aspirin  Atorvastatin (Lipitor)  Calcium  Diclofenac (Voltaren)  Fenofibrate (TriCor)  Gabapentin (Neurontin)  Levothyroxine (Synthroid)  Lisinopril (Zestril)  Metformin (Glucophage)  Metoprolol (Lopressor)  Nystatin (Mycostatin)  Topiramate (Topamax)  Vitamin D  Vitamin D3 ==================================================================== For clinical consultation, please call [phone]. ====================================================================      ROS  Constitutional: Denies any fever or chills Gastrointestinal: No  reported hemesis, hematochezia, vomiting, or acute GI distress Musculoskeletal:  Low back  pain Neurological: No reported episodes of acute onset apraxia, aphasia, dysarthria, agnosia, amnesia, paralysis, loss of coordination, or loss of consciousness  Medication Review  Calcium Carbonate-Vit D-Min, HYDROcodone-acetaminophen, Vitamin D3, aspirin EC, atorvastatin, diclofenac sodium, fenofibrate, gabapentin, levothyroxine, lisinopril, metFORMIN, metoprolol tartrate, nystatin cream, topiramate, and traMADol  History Review  Allergy: Ms. Mcdougald is allergic to aspartame and phenylalanine, penicillins, and adhesive [tape]. Drug: Ms. Boyar  reports no history of drug use. Alcohol:  reports no history of alcohol use. Tobacco:  reports that she has never smoked. She has never used smokeless tobacco. Social: Ms. Stanzione  reports that she has never smoked. She has never used smokeless tobacco. She reports that she does not drink alcohol and does not use drugs. Medical:  has a past medical history of Arthritis, Diabetes mellitus without complication ([place]), Heart murmur, Hypercholesteremia, Hypertension, Hypothyroidism, and Neuropathy. Surgical: Ms. [name]  has a past  surgical history that includes Back surgery; Cesarean section; Cholecystectomy; Abdominal hysterectomy; Cataract extraction w/PHACO (Right, 07/03/2015); Eye surgery; Colonoscopy with propofol (N/A, 06/06/2016); and Cataract extraction w/PHACO (Left, 03/01/2019). Family: family history includes Breast cancer in her paternal aunt.  Laboratory Chemistry Profile   Renal No results found for: "BUN", "CREATININE", "LABCREA", "BCR", "GFR", "GFRAA", "GFRNONAA", "LABVMA", "EPIRU", "EPINEPH24HUR", "NOREPRU", "NOREPI24HUR", "DOPARU", "DOPAM24HRUR"  Hepatic No results found for: "AST", "ALT", "ALBUMIN", "ALKPHOS", "HCVAB", "AMYLASE", "LIPASE", "AMMONIA"  Electrolytes No results found for: "NA", "K", "CL", "CALCIUM", "MG", "PHOS"  Bone No results found for: "VD25OH", "VD125OH2TOT", "HU7654YT0", "PT4656CL2", "25OHVITD1", "25OHVITD2", "25OHVITD3", "TESTOFREE", "TESTOSTERONE"  Inflammation (CRP: Acute Phase) (ESR: Chronic Phase) No results found for: "CRP", "ESRSEDRATE", "LATICACIDVEN"       Note: Above Lab results reviewed.  Recent Imaging Review  MM 3D SCREEN BREAST BILATERAL CLINICAL DATA:  Screening.  EXAM: DIGITAL SCREENING BILATERAL MAMMOGRAM WITH TOMO AND CAD  COMPARISON:  Previous exam(s).  ACR Breast Density Category b: There are scattered areas of fibroglandular density.  FINDINGS: There are no findings suspicious for malignancy. Images were processed with CAD.  IMPRESSION: No mammographic evidence of malignancy. A result letter of this screening mammogram will be mailed directly to the patient.  RECOMMENDATION: Screening mammogram in one year. (Code:SM-B-01Y)  BI-RADS CATEGORY  1: Negative.  Electronically Signed   By: Franki Cabot M.D.   On: 08/12/2017 09:53 Note: Reviewed        Physical Exam  General appearance: Well nourished, well developed, and well hydrated. In no apparent acute distress Mental status: Alert, oriented x 3 (person, place, & time)       Respiratory:  No evidence of acute respiratory distress Eyes: PERLA Vitals: BP 139/71   Pulse 74   Temp (!) 97.3 F (36.3 C)   Resp 16   Ht '4\' 11"'  (1.499 m)   Wt 180 lb (81.6 kg)   SpO2 97%   BMI 36.36 kg/m  BMI: Estimated body mass index is 36.36 kg/m as calculated from the following:   Height as of this encounter: '4\' 11"'  (1.499 m).   Weight as of this encounter: 180 lb (81.6 kg). Ideal: Patient must be at least 60 in tall to calculate ideal body weight    Lumbar Exam  Skin & Axial Inspection: Well healed scar from previous spine surgery detected Alignment: Asymmetric Functional ROM: Mechanically restricted ROM       Stability: No instability detected Muscle Tone/Strength: Functionally intact. No obvious neuro-muscular anomalies detected. Sensory (Neurological): Dermatomal pain pattern right hip/leg  Gait & Posture Assessment  Ambulation: Unassisted Gait: Antalgic Posture: Difficulty standing up straight, due to pain    Lower Extremity Exam      Side: Right lower extremity   Side: Left lower extremity  Stability: No instability observed           Stability: No instability observed          Skin & Extremity Inspection: Skin color, temperature, and hair growth are WNL. No peripheral edema or cyanosis. No masses, redness, swelling, asymmetry, or associated skin lesions. No contractures.   Skin & Extremity Inspection: Skin color, temperature, and hair growth are WNL. No peripheral edema or cyanosis. No masses, redness, swelling, asymmetry, or associated skin lesions. No contractures.  Functional ROM: Pain restricted ROM for hip and knee joints           Functional ROM: Unrestricted ROM                  Muscle Tone/Strength: Functionally intact. No obvious neuro-muscular anomalies detected.   Muscle Tone/Strength: Functionally intact. No obvious neuro-muscular anomalies detected.  Sensory (Neurological): Dermatomal pain pattern medial portion of foot (L4)   Sensory (Neurological): Unimpaired         DTR: Patellar: deferred today Achilles: deferred today Plantar: deferred today   DTR: Patellar: deferred today Achilles: deferred today Plantar: deferred today  Palpation: No palpable anomalies   Palpation: No palpable anomalies     Assessment   Diagnosis Status  1. Chronic pain syndrome   2. Lumbosacral spondylosis without myelopathy   3. Status post lumbar spinal fusion (T10-L4)   4. Postlaminectomy syndrome of lumbosacral region     Controlled Controlled Controlled   Plan of Care   Ms. Jeanella Flattery Pal has a current medication list which includes the following long-term medication(s): atorvastatin, calcium carbonate-vit d-min, fenofibrate, gabapentin, levothyroxine, lisinopril, metformin, metoprolol tartrate, topiramate, and lisinopril.  Pharmacotherapy (Medications Ordered): Meds ordered this encounter  Medications   HYDROcodone-acetaminophen (NORCO/VICODIN) 5-325 MG tablet    Sig: Take 1-2 tablets by mouth daily as needed for moderate pain. For chronic pain syndrome    Dispense:  45 tablet    Refill:  0   HYDROcodone-acetaminophen (NORCO/VICODIN) 5-325 MG tablet    Sig: Take 1-2 tablets by mouth daily as needed for moderate pain. For chronic pain syndrome    Dispense:  45 tablet    Refill:  0   HYDROcodone-acetaminophen (NORCO/VICODIN) 5-325 MG tablet    Sig: Take 1-2 tablets by mouth daily as needed for moderate pain. For chronic pain syndrome    Dispense:  45 tablet    Refill:  0   traMADol (ULTRAM) 50 MG tablet    Sig: Take 1 tablet (50 mg total) by mouth every 12 (twelve) hours as needed. For chronic pain    Dispense:  60 tablet    Refill:  2   Orders:  No orders of the defined types were placed in this encounter.  Follow-up plan:   Return in about 3 months (around 01/29/2022) for Medication Management, in person.    Recent Visits Date Type Provider Dept  08/01/21 Office Visit Gillis Santa, MD [facility]  Showing recent visits within past  90 days and meeting all other requirements Today's Visits Date Type Provider Dept  10/29/21 Office Visit Gillis Santa, MD [facility]  Showing today's visits and meeting all other requirements Future Appointments Date Type Provider Dept  01/21/22 Appointment Gillis Santa, MD [facility]  Showing future  appointments within next 90 days and meeting all other requirements  I discussed the assessment and treatment plan with the patient. The patient was provided an opportunity to ask questions and all were answered. The patient agreed with the plan and demonstrated an understanding of the instructions.  Patient advised to call back or seek an in-person evaluation if the symptoms or condition worsens.  Duration of encounter: 64mnutes.  Note by: BGillis Santa MD Date: 10/29/2021; Time: 3:05 PM

## 2021-10-29 NOTE — Progress Notes (Signed)
Nursing Pain Medication Assessment:  Safety precautions to be maintained throughout the outpatient stay will include: orient to surroundings, keep bed in low position, maintain call bell within reach at all times, provide assistance with transfer out of bed and ambulation.  Medication Inspection Compliance: Pill count conducted under aseptic conditions, in front of the patient. Neither the pills nor the bottle was removed from the patient's sight at any time. Once count was completed pills were immediately returned to the patient in their original bottle.  Medication #1: Tramadol (Ultram) Pill/Patch Count:  21 of 60 pills remain Pill/Patch Appearance: Markings consistent with prescribed medication Bottle Appearance: Standard pharmacy container. Clearly labeled. Filled Date: 06 / 08 / 2023 Last Medication intake:  Today  Medication #2: Hydrocodone/APAP Pill/Patch Count:  20 of 45 pills remain Pill/Patch Appearance: Markings consistent with prescribed medication Bottle Appearance: Standard pharmacy container. Clearly labeled. Filled Date: 06 / 26 / 2023 Last Medication intake:  Yesterday

## 2022-01-21 ENCOUNTER — Encounter: Payer: Self-pay | Admitting: Student in an Organized Health Care Education/Training Program

## 2022-01-21 ENCOUNTER — Ambulatory Visit
Payer: Medicare HMO | Attending: Student in an Organized Health Care Education/Training Program | Admitting: Student in an Organized Health Care Education/Training Program

## 2022-01-21 VITALS — BP 129/63 | HR 68 | Temp 97.1°F | Resp 16 | Ht <= 58 in | Wt 180.0 lb

## 2022-01-21 DIAGNOSIS — M47817 Spondylosis without myelopathy or radiculopathy, lumbosacral region: Secondary | ICD-10-CM

## 2022-01-21 DIAGNOSIS — M961 Postlaminectomy syndrome, not elsewhere classified: Secondary | ICD-10-CM | POA: Diagnosis not present

## 2022-01-21 DIAGNOSIS — Z981 Arthrodesis status: Secondary | ICD-10-CM | POA: Diagnosis not present

## 2022-01-21 DIAGNOSIS — G894 Chronic pain syndrome: Secondary | ICD-10-CM

## 2022-01-21 MED ORDER — HYDROCODONE-ACETAMINOPHEN 5-325 MG PO TABS: 1.0000 | ORAL_TABLET | Freq: Every day | ORAL | 0 refills | Status: AC | PRN

## 2022-01-21 MED ORDER — HYDROCODONE-ACETAMINOPHEN 5-325 MG PO TABS: 1.0000 | ORAL_TABLET | Freq: Every day | ORAL | 0 refills | Status: DC | PRN

## 2022-01-21 MED ORDER — TRAMADOL HCL 50 MG PO TABS: 50.0000 mg | ORAL_TABLET | Freq: Two times a day (BID) | ORAL | 2 refills | Status: AC | PRN

## 2022-01-21 NOTE — Progress Notes (Signed)
PROVIDER NOTE: Information contained herein reflects review and annotations entered in association with encounter. Interpretation of such information and data should be left to medically-trained personnel. Information provided to patient can be located elsewhere in the medical record under "Patient Instructions". Document created using STT-dictation technology, any transcriptional errors that may result from process are unintentional.    Patient: Cynthia Dawson  Service Category: E/M  Provider: Gillis Santa, MD  DOB: 1945/08/22  DOS: 01/21/2022  Specialty: Interventional Pain Management  MRN: [id number]  Setting: Ambulatory outpatient  PCP: Dion Body, MD  Type: Established Patient    Referring Provider: Dion Body, MD  Location: Office  Delivery: Face-to-face     HPI  [name], a 76 y.o. year old female, is here today because of her Chronic pain syndrome [G89.4]. [name] primary complain today is Back Pain (Low right) Last encounter: My last encounter with her was on 10/29/21 Pertinent problems: [name] has Chronic back pain; Chronic pain syndrome; CKD (chronic kidney disease) stage 3, GFR 30-59 ml/min (Cynthia Dawson); Lumbosacral spondylosis without myelopathy; Type 2 diabetes mellitus with kidney complication, without long-term current use of insulin (Cynthia Dawson); Postlaminectomy syndrome of lumbosacral region; Status post lumbar spinal fusion (T10-L4); Status post thoracic spinal fusion (T10-L4); and Pain management contract signed on their pertinent problem list. Pain Assessment: Severity of Chronic pain is reported as a 7 /10. Location: Back Lower, Right/radiates down back of leg. Onset: More than a month ago. Quality: Aching. Timing: Intermittent. Modifying factor(s): lying flat helps. Vitals:  height is '4\' 10"'  (1.473 m) and weight is 180 lb (81.6 kg). Her temperature is 97.1 F (36.2 C) (abnormal). Her blood pressure is 129/63 and her pulse is 68. Her respiration is 16 and oxygen  saturation is 98%.   Reason for encounter:   No change in medical history since last visit.  Patient's pain is at baseline.  Patient continues multimodal pain regimen as prescribed.  States that it provides pain relief and improvement in functional status. Kidney function stable, creatinine at 1.2.  Pharmacotherapy Assessment  Analgesic:  - Norco 5/325 mg BID (never takes more than 2x a day) - Tramadol 50 mg BID (never takes more than 2x per day)      Monitoring: [place] PMP: PDMP reviewed during this encounter.       Pharmacotherapy: No side-effects or adverse reactions reported. Compliance: No problems identified. Effectiveness: Clinically acceptable.  Cynthia Shorter, RN  01/21/2022  2:45 PM  Sign when Signing Visit Nursing Pain Medication Assessment:  Safety precautions to be maintained throughout the outpatient stay will include: orient to surroundings, keep bed in low position, maintain call bell within reach at all times, provide assistance with transfer out of bed and ambulation.  Medication Inspection Compliance: Pill count conducted under aseptic conditions, in front of the patient. Neither the pills nor the bottle was removed from the patient's sight at any time. Once count was completed pills were immediately returned to the patient in their original bottle.  Medication: Tramadol (Ultram) Pill/Patch Count:  50 of 60 pills remain Pill/Patch Appearance: Markings consistent with prescribed medication Bottle Appearance: Standard pharmacy container. Clearly labeled. Filled Date: 09 / 21 / 2023 Last Medication intake:  Today  Hydrocodone 39/45 Filled 01-13-2022 Last took yesterday   UDS:  Summary  Date Value Ref Range Status  08/01/2021 Note  Final    Comment:    ==================================================================== [employer] (MW) ==================================================================== Test  Result       Flag        Units  Drug Present and Declared for Prescription Verification   Hydromorphone                  65           EXPECTED   ng/mg creat   Norhydrocodone                 201          EXPECTED   ng/mg creat    Hydromorphone and norhydrocodone are expected metabolites of    hydrocodone. Sources of hydrocodone are scheduled prescription    medications.  Hydromorphone is also available as a scheduled    prescription medication.    Tramadol                       >6024        EXPECTED   ng/mg creat   O-Desmethyltramadol            >6024        EXPECTED   ng/mg creat   N-Desmethyltramadol            841          EXPECTED   ng/mg creat    Source of tramadol is a prescription medication. O-desmethyltramadol    and N-desmethyltramadol are expected metabolites of tramadol.  Drug Absent but Declared for Prescription Verification   Hydrocodone                    Not Detected UNEXPECTED ng/mg creat    Hydrocodone is almost always present in patients taking this drug    consistently. Absence of hydrocodone could be due to lapse of time    since the last dose or unusual pharmacokinetics (rapid metabolism).  ==================================================================== Test                      Result    Flag   Units      Ref Range   Creatinine              83               mg/dL      >=20 ==================================================================== Declared Medications:  The flagging and interpretation on this report are based on the  following declared medications.  Unexpected results may arise from  inaccuracies in the declared medications.   **Note: The testing scope of this panel includes these medications:   Hydrocodone (Norco)  Tramadol (Ultram)   **Note: The testing scope of this panel does not include the  following reported medications:   Acetaminophen (Norco)  Aspirin  Atorvastatin (Lipitor)  Calcium  Diclofenac (Voltaren)  Fenofibrate (TriCor)  Gabapentin  (Neurontin)  Levothyroxine (Synthroid)  Lisinopril (Zestril)  Metformin (Glucophage)  Metoprolol (Lopressor)  Nystatin (Mycostatin)  Topiramate (Topamax)  Vitamin D  Vitamin D3 ==================================================================== For clinical consultation, please call [phone]. ====================================================================      ROS  Constitutional: Denies any fever or chills Gastrointestinal: No reported hemesis, hematochezia, vomiting, or acute GI distress Musculoskeletal:  Low back  pain Neurological: No reported episodes of acute onset apraxia, aphasia, dysarthria, agnosia, amnesia, paralysis, loss of coordination, or loss of consciousness  Medication Review  Calcium Carbonate-Vit D-Min, HYDROcodone-acetaminophen, Vitamin D3, aspirin EC, atorvastatin, diclofenac sodium, fenofibrate, gabapentin, levothyroxine, lisinopril, metFORMIN, metoprolol tartrate, nystatin cream, topiramate, and traMADol  History Review  Allergy:  [name] is allergic to aspartame and phenylalanine, gemfibrozil, penicillins, and adhesive [tape]. Drug: [name]  reports no history of drug use. Alcohol:  reports no history of alcohol use. Tobacco:  reports that she has never smoked. She has never used smokeless tobacco. Social: [name]  reports that she has never smoked. She has never used smokeless tobacco. She reports that she does not drink alcohol and does not use drugs. Medical:  has a past medical history of Arthritis, Diabetes mellitus without complication ([place]), Heart murmur, Hypercholesteremia, Hypertension, Hypothyroidism, and Neuropathy. Surgical: [name]  has a past surgical history that includes Back surgery; Cesarean section; Cholecystectomy; Abdominal hysterectomy; Cataract extraction w/PHACO (Right, 07/03/2015); Eye surgery; Colonoscopy with propofol (N/A, 06/06/2016); and Cataract extraction w/PHACO (Left, 03/01/2019). Family: family history  includes Breast cancer in her paternal aunt.  Laboratory Chemistry Profile   Renal No results found for: "BUN", "CREATININE", "LABCREA", "BCR", "GFR", "GFRAA", "GFRNONAA", "LABVMA", "EPIRU", "EPINEPH24HUR", "NOREPRU", "NOREPI24HUR", "DOPARU", "DOPAM24HRUR"  Hepatic No results found for: "AST", "ALT", "ALBUMIN", "ALKPHOS", "HCVAB", "AMYLASE", "LIPASE", "AMMONIA"  Electrolytes No results found for: "NA", "K", "CL", "CALCIUM", "MG", "PHOS"  Bone No results found for: "VD25OH", "VD125OH2TOT", "HY0737TG6", "YI9485IO2", "25OHVITD1", "25OHVITD2", "25OHVITD3", "TESTOFREE", "TESTOSTERONE"  Inflammation (CRP: Acute Phase) (ESR: Chronic Phase) No results found for: "CRP", "ESRSEDRATE", "LATICACIDVEN"       Note: Above Lab results reviewed.  Recent Imaging Review  MM 3D SCREEN BREAST BILATERAL CLINICAL DATA:  Screening.  EXAM: DIGITAL SCREENING BILATERAL MAMMOGRAM WITH TOMO AND CAD  COMPARISON:  Previous exam(s).  ACR Breast Density Category b: There are scattered areas of fibroglandular density.  FINDINGS: There are no findings suspicious for malignancy. Images were processed with CAD.  IMPRESSION: No mammographic evidence of malignancy. A result letter of this screening mammogram will be mailed directly to the patient.  RECOMMENDATION: Screening mammogram in one year. (Code:SM-B-01Y)  BI-RADS CATEGORY  1: Negative.  Electronically Signed   By: Franki Cabot M.D.   On: 08/12/2017 09:53 Note: Reviewed        Physical Exam  General appearance: Well nourished, well developed, and well hydrated. In no apparent acute distress Mental status: Alert, oriented x 3 (person, place, & time)       Respiratory: No evidence of acute respiratory distress Eyes: PERLA Vitals: BP 129/63   Pulse 68   Temp (!) 97.1 F (36.2 C)   Resp 16   Ht '4\' 10"'  (1.473 m)   Wt 180 lb (81.6 kg)   SpO2 98%   BMI 37.62 kg/m  BMI: Estimated body mass index is 37.62 kg/m as calculated from the  following:   Height as of this encounter: '4\' 10"'  (1.473 m).   Weight as of this encounter: 180 lb (81.6 kg). Ideal: Patient must be at least 60 in tall to calculate ideal body weight    Lumbar Exam  Skin & Axial Inspection: Well healed scar from previous spine surgery detected Alignment: Asymmetric Functional ROM: Mechanically restricted ROM       Stability: No instability detected Muscle Tone/Strength: Functionally intact. No obvious neuro-muscular anomalies detected. Sensory (Neurological): Dermatomal pain pattern right hip/leg   Gait & Posture Assessment  Ambulation: Unassisted Gait: Antalgic Posture: Difficulty standing up straight, due to pain    Lower Extremity Exam      Side: Right lower extremity   Side: Left lower extremity  Stability: No instability observed           Stability: No instability observed  Skin & Extremity Inspection: Skin color, temperature, and hair growth are WNL. No peripheral edema or cyanosis. No masses, redness, swelling, asymmetry, or associated skin lesions. No contractures.   Skin & Extremity Inspection: Skin color, temperature, and hair growth are WNL. No peripheral edema or cyanosis. No masses, redness, swelling, asymmetry, or associated skin lesions. No contractures.  Functional ROM: Pain restricted ROM for hip and knee joints           Functional ROM: Unrestricted ROM                  Muscle Tone/Strength: Functionally intact. No obvious neuro-muscular anomalies detected.   Muscle Tone/Strength: Functionally intact. No obvious neuro-muscular anomalies detected.  Sensory (Neurological): Dermatomal pain pattern medial portion of foot (L4)   Sensory (Neurological): Unimpaired        DTR: Patellar: deferred today Achilles: deferred today Plantar: deferred today   DTR: Patellar: deferred today Achilles: deferred today Plantar: deferred today  Palpation: No palpable anomalies   Palpation: No palpable anomalies     Assessment   Diagnosis  Status  1. Chronic pain syndrome   2. Lumbosacral spondylosis without myelopathy   3. Status post lumbar spinal fusion (T10-L4)   4. Postlaminectomy syndrome of lumbosacral region     Controlled Controlled Controlled   Plan of Care   [name] has a current medication list which includes the following long-term medication(s): atorvastatin, calcium carbonate-vit d-min, fenofibrate, gabapentin, levothyroxine, lisinopril, metformin, metoprolol tartrate, topiramate, and lisinopril.  Pharmacotherapy (Medications Ordered): Meds ordered this encounter  Medications   HYDROcodone-acetaminophen (NORCO/VICODIN) 5-325 MG tablet    Sig: Take 1-2 tablets by mouth daily as needed for moderate pain. For chronic pain syndrome    Dispense:  45 tablet    Refill:  0   HYDROcodone-acetaminophen (NORCO/VICODIN) 5-325 MG tablet    Sig: Take 1-2 tablets by mouth daily as needed for moderate pain. For chronic pain syndrome    Dispense:  45 tablet    Refill:  0   HYDROcodone-acetaminophen (NORCO/VICODIN) 5-325 MG tablet    Sig: Take 1-2 tablets by mouth daily as needed for moderate pain. For chronic pain syndrome    Dispense:  45 tablet    Refill:  0   traMADol (ULTRAM) 50 MG tablet    Sig: Take 1 tablet (50 mg total) by mouth every 12 (twelve) hours as needed. For chronic pain    Dispense:  60 tablet    Refill:  2   Orders:  No orders of the defined types were placed in this encounter.  Follow-up plan:   Return in about 15 weeks (around 05/06/2022) for Medication Management, in person.    Recent Visits Date Type Provider Dept  10/29/21 Office Visit Gillis Santa, MD [facility]  Showing recent visits within past 90 days and meeting all other requirements Today's Visits Date Type Provider Dept  01/21/22 Office Visit Gillis Santa, MD [facility]  Showing today's visits and meeting all other requirements Future Appointments No visits were found meeting these  conditions. Showing future appointments within next 90 days and meeting all other requirements  I discussed the assessment and treatment plan with the patient. The patient was provided an opportunity to ask questions and all were answered. The patient agreed with the plan and demonstrated an understanding of the instructions.  Patient advised to call back or seek an in-person evaluation if the symptoms or condition worsens.  Duration of encounter: 19mnutes.  Note by: BCarlus Pavlov  Holley Raring, MD Date: 01/21/2022; Time: 2:56 PM

## 2022-01-21 NOTE — Progress Notes (Signed)
Nursing Pain Medication Assessment:  Safety precautions to be maintained throughout the outpatient stay will include: orient to surroundings, keep bed in low position, maintain call bell within reach at all times, provide assistance with transfer out of bed and ambulation.  Medication Inspection Compliance: Pill count conducted under aseptic conditions, in front of the patient. Neither the pills nor the bottle was removed from the patient's sight at any time. Once count was completed pills were immediately returned to the patient in their original bottle.  Medication: Tramadol (Ultram) Pill/Patch Count:  50 of 60 pills remain Pill/Patch Appearance: Markings consistent with prescribed medication Bottle Appearance: Standard pharmacy container. Clearly labeled. Filled Date: 09 / 21 / 2023 Last Medication intake:  Today  Hydrocodone 39/45 Filled 01-13-2022 Last took yesterday

## 2022-03-31 DIAGNOSIS — H43813 Vitreous degeneration, bilateral: Secondary | ICD-10-CM | POA: Diagnosis not present

## 2022-04-07 ENCOUNTER — Other Ambulatory Visit: Payer: Self-pay | Admitting: Student in an Organized Health Care Education/Training Program

## 2022-04-07 DIAGNOSIS — G894 Chronic pain syndrome: Secondary | ICD-10-CM

## 2022-04-07 DIAGNOSIS — Z981 Arthrodesis status: Secondary | ICD-10-CM

## 2022-04-07 DIAGNOSIS — M47817 Spondylosis without myelopathy or radiculopathy, lumbosacral region: Secondary | ICD-10-CM

## 2022-04-07 DIAGNOSIS — M961 Postlaminectomy syndrome, not elsewhere classified: Secondary | ICD-10-CM

## 2022-04-24 DIAGNOSIS — E782 Mixed hyperlipidemia: Secondary | ICD-10-CM | POA: Diagnosis not present

## 2022-04-24 DIAGNOSIS — E1122 Type 2 diabetes mellitus with diabetic chronic kidney disease: Secondary | ICD-10-CM | POA: Diagnosis not present

## 2022-04-24 DIAGNOSIS — E039 Hypothyroidism, unspecified: Secondary | ICD-10-CM | POA: Diagnosis not present

## 2022-04-24 DIAGNOSIS — N1831 Chronic kidney disease, stage 3a: Secondary | ICD-10-CM | POA: Diagnosis not present

## 2022-04-29 ENCOUNTER — Ambulatory Visit
Payer: Medicare HMO | Attending: Student in an Organized Health Care Education/Training Program | Admitting: Student in an Organized Health Care Education/Training Program

## 2022-04-29 ENCOUNTER — Encounter: Payer: Self-pay | Admitting: Student in an Organized Health Care Education/Training Program

## 2022-04-29 VITALS — BP 135/75 | HR 72 | Temp 97.2°F | Resp 18 | Ht <= 58 in | Wt 180.0 lb

## 2022-04-29 DIAGNOSIS — M47817 Spondylosis without myelopathy or radiculopathy, lumbosacral region: Secondary | ICD-10-CM | POA: Diagnosis not present

## 2022-04-29 DIAGNOSIS — Z981 Arthrodesis status: Secondary | ICD-10-CM | POA: Diagnosis not present

## 2022-04-29 DIAGNOSIS — G894 Chronic pain syndrome: Secondary | ICD-10-CM | POA: Insufficient documentation

## 2022-04-29 DIAGNOSIS — M961 Postlaminectomy syndrome, not elsewhere classified: Secondary | ICD-10-CM | POA: Diagnosis not present

## 2022-04-29 MED ORDER — TOPIRAMATE 50 MG PO TABS: 50.0000 mg | ORAL_TABLET | Freq: Two times a day (BID) | ORAL | 11 refills | Status: DC

## 2022-04-29 MED ORDER — HYDROCODONE-ACETAMINOPHEN 5-325 MG PO TABS: 1.0000 | ORAL_TABLET | Freq: Every day | ORAL | 0 refills | Status: DC | PRN

## 2022-04-29 MED ORDER — TRAMADOL HCL 50 MG PO TABS: 50.0000 mg | ORAL_TABLET | Freq: Two times a day (BID) | ORAL | 2 refills | Status: DC | PRN

## 2022-04-29 MED ORDER — TRAMADOL HCL 50 MG PO TABS: 50.0000 mg | ORAL_TABLET | Freq: Two times a day (BID) | ORAL | 0 refills | Status: DC | PRN

## 2022-04-29 MED ORDER — HYDROCODONE-ACETAMINOPHEN 5-325 MG PO TABS: 1.0000 | ORAL_TABLET | Freq: Every day | ORAL | 0 refills | Status: AC | PRN

## 2022-04-29 MED ORDER — GABAPENTIN 300 MG PO CAPS: 300.0000 mg | ORAL_CAPSULE | Freq: Two times a day (BID) | ORAL | 11 refills | Status: DC

## 2022-04-29 NOTE — Progress Notes (Signed)
PROVIDER NOTE: Information contained herein reflects review and annotations entered in association with encounter. Interpretation of such information and data should be left to medically-trained personnel. Information provided to patient can be located elsewhere in the medical record under "Patient Instructions". Document created using STT-dictation technology, any transcriptional errors that may result from process are unintentional.    Patient: Cynthia Dawson  Service Category: E/M  Provider: Gillis Santa, MD  DOB: April 19, 1946  DOS: 04/29/2022  Specialty: Interventional Pain Management  MRN: [id number]  Setting: Ambulatory outpatient  PCP: Dion Body, MD  Type: Established Patient    Referring Provider: Dion Body, MD  Location: Office  Delivery: Face-to-face     HPI  [name], a 77 y.o. year old female, is here today because of her Chronic pain syndrome [G89.4]. Cynthia Dawson primary complain today is Back Pain (low) Last encounter: My last encounter with her was on 01/21/22 Pertinent problems: Cynthia Dawson has Chronic back pain; Chronic pain syndrome; CKD (chronic kidney disease) stage 3, GFR 30-59 ml/min ([place]); Lumbosacral spondylosis without myelopathy; Type 2 diabetes mellitus with kidney complication, without long-term current use of insulin (Cynthia Dawson); Postlaminectomy syndrome of lumbosacral region; Status post lumbar spinal fusion (T10-L4); Status post thoracic spinal fusion (T10-L4); and Pain management contract signed on their pertinent problem list. Pain Assessment: Severity of Chronic pain is reported as a 6 /10. Location: Back Right/radiates into right hip. Onset: More than a month ago. Quality: Aching. Timing: Intermittent. Modifying factor(s): meds, rest. Vitals:  height is '4\' 10"'$  (1.473 m) and weight is 180 lb (81.6 kg). Her temperature is 97.2 F (36.2 C) (abnormal). Her blood pressure is 135/75 and her pulse is 72. Her respiration is 18 and oxygen saturation is 97%.    Reason for encounter:   No change in medical history since last visit.  Patient's pain is at baseline.  Patient continues multimodal pain regimen as prescribed.  States that it provides pain relief and improvement in functional status.  Pharmacotherapy Assessment  Analgesic:  - Norco 5/325 mg BID (never takes more than 2x a day) - Tramadol 50 mg BID (never takes more than 2x per day)      Monitoring: [place] PMP: PDMP reviewed during this encounter.       Pharmacotherapy: No side-effects or adverse reactions reported. Compliance: No problems identified. Effectiveness: Clinically acceptable.  Cynthia Shorter, RN  04/29/2022  1:15 PM  Sign when Signing Visit Nursing Pain Medication Assessment:  Safety precautions to be maintained throughout the outpatient stay will include: orient to surroundings, keep bed in low position, maintain call bell within reach at all times, provide assistance with transfer out of bed and ambulation.  Medication Inspection Compliance: Pill count conducted under aseptic conditions, in front of the patient. Neither the pills nor the bottle was removed from the patient's sight at any time. Once count was completed pills were immediately returned to the patient in their original bottle.  Medication: Hydrocodone/APAP Pill/Patch Count:  39 of 45 pills remain Pill/Patch Appearance: Markings consistent with prescribed medication Bottle Appearance: Standard pharmacy container. Clearly labeled. Filled Date: 77 / 53 / 2023 Last Medication intake:  Yesterday  Tramadol 41/60 Filled 04-08-2022 today        UDS:  Summary  Date Value Ref Range Status  08/01/2021 Note  Final    Comment:    ==================================================================== [facility] (MW) ==================================================================== Test  Result       Flag       Units  Drug Present and Declared for Prescription Verification    Hydromorphone                  65           EXPECTED   ng/mg creat   Norhydrocodone                 201          EXPECTED   ng/mg creat    Hydromorphone and norhydrocodone are expected metabolites of    hydrocodone. Sources of hydrocodone are scheduled prescription    medications.  Hydromorphone is also available as a scheduled    prescription medication.    Tramadol                       >6024        EXPECTED   ng/mg creat   O-Desmethyltramadol            >6024        EXPECTED   ng/mg creat   N-Desmethyltramadol            841          EXPECTED   ng/mg creat    Source of tramadol is a prescription medication. O-desmethyltramadol    and N-desmethyltramadol are expected metabolites of tramadol.  Drug Absent but Declared for Prescription Verification   Hydrocodone                    Not Detected UNEXPECTED ng/mg creat    Hydrocodone is almost always present in patients taking this drug    consistently. Absence of hydrocodone could be due to lapse of time    since the last dose or unusual pharmacokinetics (rapid metabolism).  ==================================================================== Test                      Result    Flag   Units      Ref Range   Creatinine              83               mg/dL      >=20 ==================================================================== Declared Medications:  The flagging and interpretation on this report are based on the  following declared medications.  Unexpected results may arise from  inaccuracies in the declared medications.   **Note: The testing scope of this panel includes these medications:   Hydrocodone (Norco)  Tramadol (Ultram)   **Note: The testing scope of this panel does not include the  following reported medications:   Acetaminophen (Norco)  Aspirin  Atorvastatin (Lipitor)  Calcium  Diclofenac (Voltaren)  Fenofibrate (TriCor)  Gabapentin (Neurontin)  Levothyroxine (Synthroid)  Lisinopril (Zestril)  Metformin  (Glucophage)  Metoprolol (Lopressor)  Nystatin (Mycostatin)  Topiramate (Topamax)  Vitamin D  Vitamin D3 ==================================================================== For clinical consultation, please call [phone]. ====================================================================      ROS  Constitutional: Denies any fever or chills Gastrointestinal: No reported hemesis, hematochezia, vomiting, or acute GI distress Musculoskeletal:  Low back  pain Neurological: No reported episodes of acute onset apraxia, aphasia, dysarthria, agnosia, amnesia, paralysis, loss of coordination, or loss of consciousness  Medication Review  Calcium Carbonate-Vit D-Min, HYDROcodone-acetaminophen, Vitamin D3, aspirin EC, atorvastatin, diclofenac sodium, fenofibrate, gabapentin, levothyroxine, lisinopril, metFORMIN, metoprolol tartrate, nystatin cream, topiramate, and traMADol  History Review  Allergy:  Ms. Bardwell is allergic to aspartame and phenylalanine, gemfibrozil, penicillins, and adhesive [tape]. Drug: Ms. Langsam  reports no history of drug use. Alcohol:  reports no history of alcohol use. Tobacco:  reports that she has never smoked. She has never used smokeless tobacco. Social: Ms. Pomeroy  reports that she has never smoked. She has never used smokeless tobacco. She reports that she does not drink alcohol and does not use drugs. Medical:  has a past medical history of Arthritis, Diabetes mellitus without complication ([place]), Heart murmur, Hypercholesteremia, Hypertension, Hypothyroidism, and Neuropathy. Surgical: Ms. Tamargo  has a past surgical history that includes Back surgery; Cesarean section; Cholecystectomy; Abdominal hysterectomy; Cataract extraction w/PHACO (Right, 07/03/2015); Eye surgery; Colonoscopy with propofol (N/A, 06/06/2016); and Cataract extraction w/PHACO (Left, 03/01/2019). Family: family history includes Breast cancer in her paternal aunt.  Laboratory Chemistry Profile    Renal No results found for: "BUN", "CREATININE", "LABCREA", "BCR", "GFR", "GFRAA", "GFRNONAA", "LABVMA", "EPIRU", "EPINEPH24HUR", "NOREPRU", "NOREPI24HUR", "DOPARU", "DOPAM24HRUR"  Hepatic No results found for: "AST", "ALT", "ALBUMIN", "ALKPHOS", "HCVAB", "AMYLASE", "LIPASE", "AMMONIA"  Electrolytes No results found for: "NA", "K", "CL", "CALCIUM", "MG", "PHOS"  Bone No results found for: "VD25OH", "VD125OH2TOT", "PP2951OA4", "[id number]", "25OHVITD1", "25OHVITD2", "25OHVITD3", "TESTOFREE", "TESTOSTERONE"  Inflammation (CRP: Acute Phase) (ESR: Chronic Phase) No results found for: "CRP", "ESRSEDRATE", "LATICACIDVEN"       Note: Above Lab results reviewed.  Recent Imaging Review  MM 3D SCREEN BREAST BILATERAL CLINICAL DATA:  Screening.  EXAM: DIGITAL SCREENING BILATERAL MAMMOGRAM WITH TOMO AND CAD  COMPARISON:  Previous exam(s).  ACR Breast Density Category b: There are scattered areas of fibroglandular density.  FINDINGS: There are no findings suspicious for malignancy. Images were processed with CAD.  IMPRESSION: No mammographic evidence of malignancy. A result letter of this screening mammogram will be mailed directly to the patient.  RECOMMENDATION: Screening mammogram in one year. (Code:SM-B-01Y)  BI-RADS CATEGORY  1: Negative.  Electronically Signed   By: Franki Cabot M.D.   On: 08/12/2017 09:53 Note: Reviewed        Physical Exam  General appearance: Well nourished, well developed, and well hydrated. In no apparent acute distress Mental status: Alert, oriented x 3 (person, place, & time)       Respiratory: No evidence of acute respiratory distress Eyes: PERLA Vitals: BP 135/75   Pulse 72   Temp (!) 97.2 F (36.2 C)   Resp 18   Ht '4\' 10"'$  (1.473 m)   Wt 180 lb (81.6 kg)   SpO2 97%   BMI 37.62 kg/m  BMI: Estimated body mass index is 37.62 kg/m as calculated from the following:   Height as of this encounter: '4\' 10"'$  (1.473 m).   Weight as of this  encounter: 180 lb (81.6 kg). Ideal: Patient must be at least 60 in tall to calculate ideal body weight    Lumbar Exam  Skin & Axial Inspection: Well healed scar from previous spine surgery detected Alignment: Asymmetric Functional ROM: Mechanically restricted ROM       Stability: No instability detected Muscle Tone/Strength: Functionally intact. No obvious neuro-muscular anomalies detected. Sensory (Neurological): Dermatomal pain pattern right hip/leg   Gait & Posture Assessment  Ambulation: Unassisted Gait: Antalgic Posture: Difficulty standing up straight, due to pain    Lower Extremity Exam      Side: Right lower extremity   Side: Left lower extremity  Stability: No instability observed           Stability: No instability observed  Skin & Extremity Inspection: Skin color, temperature, and hair growth are WNL. No peripheral edema or cyanosis. No masses, redness, swelling, asymmetry, or associated skin lesions. No contractures.   Skin & Extremity Inspection: Skin color, temperature, and hair growth are WNL. No peripheral edema or cyanosis. No masses, redness, swelling, asymmetry, or associated skin lesions. No contractures.  Functional ROM: Pain restricted ROM for hip and knee joints           Functional ROM: Unrestricted ROM                  Muscle Tone/Strength: Functionally intact. No obvious neuro-muscular anomalies detected.   Muscle Tone/Strength: Functionally intact. No obvious neuro-muscular anomalies detected.  Sensory (Neurological): Dermatomal pain pattern medial portion of foot (L4)   Sensory (Neurological): Unimpaired        DTR: Patellar: deferred today Achilles: deferred today Plantar: deferred today   DTR: Patellar: deferred today Achilles: deferred today Plantar: deferred today  Palpation: No palpable anomalies   Palpation: No palpable anomalies     Assessment   Diagnosis Status  1. Chronic pain syndrome   2. Lumbosacral spondylosis without myelopathy    3. Status post lumbar spinal fusion (T10-L4)   4. Postlaminectomy syndrome of lumbosacral region   5. Status post thoracic spinal fusion (T10-L4)     Controlled Controlled Controlled   Plan of Care   Ms. Jeanella Flattery Janice has a current medication list which includes the following long-term medication(s): atorvastatin, calcium carbonate-vit d-min, fenofibrate, levothyroxine, lisinopril, metformin, metoprolol tartrate, gabapentin, and topiramate.  Pharmacotherapy (Medications Ordered): Meds ordered this encounter  Medications   HYDROcodone-acetaminophen (NORCO/VICODIN) 5-325 MG tablet    Sig: Take 1-2 tablets by mouth daily as needed for moderate pain. For chronic pain syndrome    Dispense:  45 tablet    Refill:  0   HYDROcodone-acetaminophen (NORCO/VICODIN) 5-325 MG tablet    Sig: Take 1-2 tablets by mouth daily as needed for moderate pain. For chronic pain syndrome    Dispense:  45 tablet    Refill:  0   HYDROcodone-acetaminophen (NORCO/VICODIN) 5-325 MG tablet    Sig: Take 1-2 tablets by mouth daily as needed for moderate pain. For chronic pain syndrome    Dispense:  45 tablet    Refill:  0   DISCONTD: traMADol (ULTRAM) 50 MG tablet    Sig: Take 1 tablet (50 mg total) by mouth every 12 (twelve) hours as needed for severe pain. Must last 30 days    Dispense:  60 tablet    Refill:  0    Chronic Pain: STOP Act (Not applicable) Fill 1 day early if closed on refill date. Avoid benzodiazepines within 8 hours of opioids   gabapentin (NEURONTIN) 300 MG capsule    Sig: Take 1 capsule (300 mg total) by mouth 2 (two) times daily.    Dispense:  60 capsule    Refill:  11   topiramate (TOPAMAX) 50 MG tablet    Sig: Take 1 tablet (50 mg total) by mouth 2 (two) times daily.    Dispense:  60 tablet    Refill:  11   traMADol (ULTRAM) 50 MG tablet    Sig: Take 1 tablet (50 mg total) by mouth every 12 (twelve) hours as needed for severe pain. Must last 30 days    Dispense:  60 tablet     Refill:  2    Chronic Pain: STOP Act (Not applicable) Fill 1 day early if closed on refill date.  Avoid benzodiazepines within 8 hours of opioids   Orders:  No orders of the defined types were placed in this encounter.  Follow-up plan:   Return in about 3 months (around 08/06/2022) for Medication Management, in person.    Recent Visits No visits were found meeting these conditions. Showing recent visits within past 90 days and meeting all other requirements Today's Visits Date Type Provider Dept  04/29/22 Office Visit Gillis Santa, MD [facility]  Showing today's visits and meeting all other requirements Future Appointments No visits were found meeting these conditions. Showing future appointments within next 90 days and meeting all other requirements  I discussed the assessment and treatment plan with the patient. The patient was provided an opportunity to ask questions and all were answered. The patient agreed with the plan and demonstrated an understanding of the instructions.  Patient advised to call back or seek an in-person evaluation if the symptoms or condition worsens.  Duration of encounter: 30mnutes.  Note by: BGillis Santa MD Date: 04/29/2022; Time: 2:05 PM

## 2022-04-29 NOTE — Progress Notes (Signed)
Nursing Pain Medication Assessment:  Safety precautions to be maintained throughout the outpatient stay will include: orient to surroundings, keep bed in low position, maintain call bell within reach at all times, provide assistance with transfer out of bed and ambulation.  Medication Inspection Compliance: Pill count conducted under aseptic conditions, in front of the patient. Neither the pills nor the bottle was removed from the patient's sight at any time. Once count was completed pills were immediately returned to the patient in their original bottle.  Medication: Hydrocodone/APAP Pill/Patch Count:  39 of 45 pills remain Pill/Patch Appearance: Markings consistent with prescribed medication Bottle Appearance: Standard pharmacy container. Clearly labeled. Filled Date: 13 / 28 / 2023 Last Medication intake:  Yesterday  Tramadol 41/60 Filled 04-08-2022 today

## 2022-04-30 DIAGNOSIS — E119 Type 2 diabetes mellitus without complications: Secondary | ICD-10-CM | POA: Diagnosis not present

## 2022-04-30 DIAGNOSIS — Z961 Presence of intraocular lens: Secondary | ICD-10-CM | POA: Diagnosis not present

## 2022-04-30 DIAGNOSIS — H40003 Preglaucoma, unspecified, bilateral: Secondary | ICD-10-CM | POA: Diagnosis not present

## 2022-04-30 DIAGNOSIS — H47322 Drusen of optic disc, left eye: Secondary | ICD-10-CM | POA: Diagnosis not present

## 2022-05-06 DIAGNOSIS — Z0000 Encounter for general adult medical examination without abnormal findings: Secondary | ICD-10-CM | POA: Diagnosis not present

## 2022-05-06 DIAGNOSIS — E669 Obesity, unspecified: Secondary | ICD-10-CM | POA: Diagnosis not present

## 2022-05-06 DIAGNOSIS — E119 Type 2 diabetes mellitus without complications: Secondary | ICD-10-CM | POA: Diagnosis not present

## 2022-05-06 DIAGNOSIS — I10 Essential (primary) hypertension: Secondary | ICD-10-CM | POA: Diagnosis not present

## 2022-05-06 DIAGNOSIS — E785 Hyperlipidemia, unspecified: Secondary | ICD-10-CM | POA: Diagnosis not present

## 2022-05-06 DIAGNOSIS — Z1331 Encounter for screening for depression: Secondary | ICD-10-CM | POA: Diagnosis not present

## 2022-05-06 DIAGNOSIS — Z6838 Body mass index (BMI) 38.0-38.9, adult: Secondary | ICD-10-CM | POA: Diagnosis not present

## 2022-08-07 ENCOUNTER — Ambulatory Visit
Payer: Medicare HMO | Attending: Student in an Organized Health Care Education/Training Program | Admitting: Student in an Organized Health Care Education/Training Program

## 2022-08-07 ENCOUNTER — Encounter: Payer: Self-pay | Admitting: Student in an Organized Health Care Education/Training Program

## 2022-08-07 VITALS — BP 166/85 | HR 67 | Temp 97.3°F | Resp 18 | Ht 58.5 in | Wt 180.0 lb

## 2022-08-07 DIAGNOSIS — Z981 Arthrodesis status: Secondary | ICD-10-CM

## 2022-08-07 DIAGNOSIS — M961 Postlaminectomy syndrome, not elsewhere classified: Secondary | ICD-10-CM | POA: Insufficient documentation

## 2022-08-07 DIAGNOSIS — Z0289 Encounter for other administrative examinations: Secondary | ICD-10-CM | POA: Insufficient documentation

## 2022-08-07 DIAGNOSIS — G894 Chronic pain syndrome: Secondary | ICD-10-CM | POA: Insufficient documentation

## 2022-08-07 DIAGNOSIS — M47817 Spondylosis without myelopathy or radiculopathy, lumbosacral region: Secondary | ICD-10-CM | POA: Insufficient documentation

## 2022-08-07 MED ORDER — HYDROCODONE-ACETAMINOPHEN 5-325 MG PO TABS
1.0000 | ORAL_TABLET | Freq: Every day | ORAL | 0 refills | Status: AC | PRN
Start: 2022-08-18 — End: 2022-09-17

## 2022-08-07 MED ORDER — HYDROCODONE-ACETAMINOPHEN 5-325 MG PO TABS
1.0000 | ORAL_TABLET | Freq: Every day | ORAL | 0 refills | Status: AC | PRN
Start: 2022-09-17 — End: 2022-10-17

## 2022-08-07 MED ORDER — TRAMADOL HCL 50 MG PO TABS: 50.0000 mg | ORAL_TABLET | Freq: Two times a day (BID) | ORAL | 2 refills | Status: DC | PRN

## 2022-08-07 MED ORDER — HYDROCODONE-ACETAMINOPHEN 5-325 MG PO TABS
1.0000 | ORAL_TABLET | Freq: Every day | ORAL | 0 refills | Status: DC | PRN
Start: 2022-10-17 — End: 2022-11-04

## 2022-08-07 NOTE — Progress Notes (Signed)
Safety precautions to be maintained throughout the outpatient stay will include: orient to surroundings, keep bed in low position, maintain call bell within reach at all times, provide assistance with transfer out of bed and ambulation.    Nursing Pain Medication Assessment:  Safety precautions to be maintained throughout the outpatient stay will include: orient to surroundings, keep bed in low position, maintain call bell within reach at all times, provide assistance with transfer out of bed and ambulation.  Medication Inspection Compliance: Pill count conducted under aseptic conditions, in front of the patient. Neither the pills nor the bottle was removed from the patient's sight at any time. Once count was completed pills were immediately returned to the patient in their original bottle.  Medication #1: Tramadol (Ultram) Pill/Patch Count:  34 of 60 pills remain Pill/Patch Appearance: Markings consistent with prescribed medication Bottle Appearance: Standard pharmacy container. Clearly labeled. Filled Date: 03 / 20 / 2024 Last Medication intake:  Today  Medication #2: Hydrocodone/APAP Pill/Patch Count:  31 of 45 pills remain Pill/Patch Appearance: Markings consistent with prescribed medication Bottle Appearance: Standard pharmacy container. Clearly labeled. Filled Date: 03 / 29 / 2024 Last Medication intake:  Yesterday

## 2022-08-07 NOTE — Progress Notes (Signed)
PROVIDER NOTE: Information contained herein reflects review and annotations entered in association with encounter. Interpretation of such information and data should be left to medically-trained personnel. Information provided to patient can be located elsewhere in the medical record under "Patient Instructions". Document created using STT-dictation technology, any transcriptional errors that may result from process are unintentional.    Patient: Cynthia Dawson  Service Category: E/M  Provider: Edward Jolly, MD  DOB: Jun 24, 1945  DOS: 08/07/2022  Specialty: Interventional Pain Management  MRN: [id number]  Setting: Ambulatory outpatient  PCP: Marisue Ivan, MD  Type: Established Patient    Referring Provider: Marisue Ivan, MD  Location: Office  Delivery: Face-to-face     HPI  Cynthia Dawson, a 77 y.o. year old female, is here today because of her Chronic pain syndrome [G89.4]. Cynthia Dawson primary complain today is Back Pain Last encounter: My last encounter with her was on 04/29/22 Pertinent problems: Cynthia Dawson has Chronic back pain; Chronic pain syndrome; CKD (chronic kidney disease) stage 3, GFR 30-59 ml/min; Lumbosacral spondylosis without myelopathy; Type 2 diabetes mellitus with kidney complication, without long-term current use of insulin; Postlaminectomy syndrome of lumbosacral region; Status post lumbar spinal fusion (T10-L4); Status post thoracic spinal fusion (T10-L4); and Pain management contract signed on their pertinent problem list. Pain Assessment: Severity of Chronic pain is reported as a 5 /10. Location: Back Lower, Right/Radaites to right hip. Onset: More than a month ago. Quality: Constant. Timing: Constant. Modifying factor(s): Sitting and medication. Vitals:  height is 4' 10.5" (1.486 m) and weight is 180 lb (81.6 kg). Her temporal temperature is 97.3 F (36.3 C) (abnormal). Her blood pressure is 166/85 (abnormal) and her pulse is 67. Her respiration is 18 and oxygen  saturation is 96%.   Reason for encounter:   No change in medical history since last visit.  Patient's pain is at baseline.  Patient continues multimodal pain regimen as prescribed.  States that it provides pain relief and improvement in functional status. Discussed OTC options for insomnia including melatonin and magnesium  Pharmacotherapy Assessment  Analgesic:  - Norco 5/325 mg BID (never takes more than 2x a day) - Tramadol 50 mg BID (never takes more than 2x per day)      Monitoring: [place] PMP: PDMP reviewed during this encounter.       Pharmacotherapy: No side-effects or adverse reactions reported. Compliance: No problems identified. Effectiveness: Clinically acceptable.  Earlyne Iba, RN  08/07/2022  2:51 PM  Sign when Signing Visit Safety precautions to be maintained throughout the outpatient stay will include: orient to surroundings, keep bed in low position, maintain call bell within reach at all times, provide assistance with transfer out of bed and ambulation.    Nursing Pain Medication Assessment:  Safety precautions to be maintained throughout the outpatient stay will include: orient to surroundings, keep bed in low position, maintain call bell within reach at all times, provide assistance with transfer out of bed and ambulation.  Medication Inspection Compliance: Pill count conducted under aseptic conditions, in front of the patient. Neither the pills nor the bottle was removed from the patient's sight at any time. Once count was completed pills were immediately returned to the patient in their original bottle.  Medication #1: Tramadol (Ultram) Pill/Patch Count:  34 of 60 pills remain Pill/Patch Appearance: Markings consistent with prescribed medication Bottle Appearance: Standard pharmacy container. Clearly labeled. Filled Date: 03 / 20 / 2024 Last Medication intake:  Today  Medication #2: Hydrocodone/APAP Pill/Patch Count:  31 of  45 pills remain Pill/Patch Appearance:  Markings consistent with prescribed medication Bottle Appearance: Standard pharmacy container. Clearly labeled. Filled Date: 03 / 29 / 2024 Last Medication intake:  Yesterday        UDS:  Summary  Date Value Ref Range Status  08/01/2021 Note  Final    Comment:    ==================================================================== [employer] (MW) ==================================================================== Test                             Result       Flag       Units  Drug Present and Declared for Prescription Verification   Hydromorphone                  65           EXPECTED   ng/mg creat   Norhydrocodone                 201          EXPECTED   ng/mg creat    Hydromorphone and norhydrocodone are expected metabolites of    hydrocodone. Sources of hydrocodone are scheduled prescription    medications.  Hydromorphone is also available as a scheduled    prescription medication.    Tramadol                       >6024        EXPECTED   ng/mg creat   O-Desmethyltramadol            >6024        EXPECTED   ng/mg creat   N-Desmethyltramadol            841          EXPECTED   ng/mg creat    Source of tramadol is a prescription medication. O-desmethyltramadol    and N-desmethyltramadol are expected metabolites of tramadol.  Drug Absent but Declared for Prescription Verification   Hydrocodone                    Not Detected UNEXPECTED ng/mg creat    Hydrocodone is almost always present in patients taking this drug    consistently. Absence of hydrocodone could be due to lapse of time    since the last dose or unusual pharmacokinetics (rapid metabolism).  ==================================================================== Test                      Result    Flag   Units      Ref Range   Creatinine              83               mg/dL      >=16 ==================================================================== Declared Medications:  The flagging and interpretation on  this report are based on the  following declared medications.  Unexpected results may arise from  inaccuracies in the declared medications.   **Note: The testing scope of this panel includes these medications:   Hydrocodone (Norco)  Tramadol (Ultram)   **Note: The testing scope of this panel does not include the  following reported medications:   Acetaminophen (Norco)  Aspirin  Atorvastatin (Lipitor)  Calcium  Diclofenac (Voltaren)  Fenofibrate (TriCor)  Gabapentin (Neurontin)  Levothyroxine (Synthroid)  Lisinopril (Zestril)  Metformin (Glucophage)  Metoprolol (Lopressor)  Nystatin (Mycostatin)  Topiramate (Topamax)  Vitamin D  Vitamin D3 ==================================================================== For clinical consultation, please call [phone]. ====================================================================      ROS  Constitutional: Denies any fever or chills Gastrointestinal: No reported hemesis, hematochezia, vomiting, or acute GI distress Musculoskeletal:  Low back  pain Neurological: No reported episodes of acute onset apraxia, aphasia, dysarthria, agnosia, amnesia, paralysis, loss of coordination, or loss of consciousness  Medication Review  Calcium Carbonate-Vit D-Min, HYDROcodone-acetaminophen, Vitamin D3, aspirin EC, atorvastatin, diclofenac sodium, fenofibrate, gabapentin, levothyroxine, lisinopril, metFORMIN, metoprolol tartrate, nystatin cream, topiramate, and traMADol  History Review  Allergy: Cynthia Dawson is allergic to aspartame and phenylalanine, gemfibrozil, penicillins, and adhesive [tape]. Drug: Cynthia Dawson  reports no history of drug use. Alcohol:  reports no history of alcohol use. Tobacco:  reports that she has never smoked. She has never used smokeless tobacco. Social: Cynthia Dawson  reports that she has never smoked. She has never used smokeless tobacco. She reports that she does not drink alcohol and does not use drugs. Medical:  has  a past medical history of Arthritis, Diabetes mellitus without complication, Heart murmur, Hypercholesteremia, Hypertension, Hypothyroidism, and Neuropathy. Surgical: Cynthia Dawson  has a past surgical history that includes Back surgery; Cesarean section; Cholecystectomy; Abdominal hysterectomy; Cataract extraction w/PHACO (Right, 07/03/2015); Eye surgery; Colonoscopy with propofol (N/A, 06/06/2016); and Cataract extraction w/PHACO (Left, 03/01/2019). Family: family history includes Breast cancer in her paternal aunt.  Laboratory Chemistry Profile   Renal No results found for: "BUN", "CREATININE", "LABCREA", "BCR", "GFR", "GFRAA", "GFRNONAA", "LABVMA", "EPIRU", "EPINEPH24HUR", "NOREPRU", "NOREPI24HUR", "DOPARU", "DOPAM24HRUR"  Hepatic No results found for: "AST", "ALT", "ALBUMIN", "ALKPHOS", "HCVAB", "AMYLASE", "LIPASE", "AMMONIA"  Electrolytes No results found for: "NA", "K", "CL", "CALCIUM", "MG", "PHOS"  Bone No results found for: "VD25OH", "VD125OH2TOT", "[id number]", "[id number]", "25OHVITD1", "25OHVITD2", "25OHVITD3", "TESTOFREE", "TESTOSTERONE"  Inflammation (CRP: Acute Phase) (ESR: Chronic Phase) No results found for: "CRP", "ESRSEDRATE", "LATICACIDVEN"       Note: Above Lab results reviewed.  Recent Imaging Review  MM 3D SCREEN BREAST BILATERAL CLINICAL DATA:  Screening.  EXAM: DIGITAL SCREENING BILATERAL MAMMOGRAM WITH TOMO AND CAD  COMPARISON:  Previous exam(s).  ACR Breast Density Category b: There are scattered areas of fibroglandular density.  FINDINGS: There are no findings suspicious for malignancy. Images were processed with CAD.  IMPRESSION: No mammographic evidence of malignancy. A result letter of this screening mammogram will be mailed directly to the patient.  RECOMMENDATION: Screening mammogram in one year. (Code:SM-B-01Y)  BI-RADS CATEGORY  1: Negative.  Electronically Signed   By: Bary Richard M.D.   On: 08/12/2017 09:53 Note: Reviewed        Physical  Exam  General appearance: Well nourished, well developed, and well hydrated. In no apparent acute distress Mental status: Alert, oriented x 3 (person, place, & time)       Respiratory: No evidence of acute respiratory distress Eyes: PERLA Vitals: BP (!) 166/85   Pulse 67   Temp (!) 97.3 F (36.3 C) (Temporal)   Resp 18   Ht 4' 10.5" (1.486 m)   Wt 180 lb (81.6 kg)   SpO2 96%   BMI 36.98 kg/m  BMI: Estimated body mass index is 36.98 kg/m as calculated from the following:   Height as of this encounter: 4' 10.5" (1.486 m).   Weight as of this encounter: 180 lb (81.6 kg). Ideal: Patient must be at least 60 in tall to calculate ideal body weight    Lumbar Exam  Skin & Axial Inspection: Well healed scar from previous spine surgery detected Alignment: Asymmetric Functional ROM: Mechanically  restricted ROM       Stability: No instability detected Muscle Tone/Strength: Functionally intact. No obvious neuro-muscular anomalies detected. Sensory (Neurological): Dermatomal pain pattern right hip/leg   Gait & Posture Assessment  Ambulation: Unassisted Gait: Antalgic Posture: Difficulty standing up straight, due to pain    Lower Extremity Exam      Side: Right lower extremity   Side: Left lower extremity  Stability: No instability observed           Stability: No instability observed          Skin & Extremity Inspection: Skin color, temperature, and hair growth are WNL. No peripheral edema or cyanosis. No masses, redness, swelling, asymmetry, or associated skin lesions. No contractures.   Skin & Extremity Inspection: Skin color, temperature, and hair growth are WNL. No peripheral edema or cyanosis. No masses, redness, swelling, asymmetry, or associated skin lesions. No contractures.  Functional ROM: Pain restricted ROM for hip and knee joints           Functional ROM: Unrestricted ROM                  Muscle Tone/Strength: Functionally intact. No obvious neuro-muscular anomalies detected.    Muscle Tone/Strength: Functionally intact. No obvious neuro-muscular anomalies detected.  Sensory (Neurological): Dermatomal pain pattern medial portion of foot (L4)   Sensory (Neurological): Unimpaired        DTR: Patellar: deferred today Achilles: deferred today Plantar: deferred today   DTR: Patellar: deferred today Achilles: deferred today Plantar: deferred today  Palpation: No palpable anomalies   Palpation: No palpable anomalies     Assessment   Diagnosis Status  1. Chronic pain syndrome   2. Lumbosacral spondylosis without myelopathy   3. Status post lumbar spinal fusion (T10-L4)   4. Postlaminectomy syndrome of lumbosacral region   5. Status post thoracic spinal fusion (T10-L4)   6. Pain management contract signed     Controlled Controlled Controlled   Plan of Care   Cynthia Dawson has a current medication list which includes the following long-term medication(s): atorvastatin, calcium carbonate-vit d-min, fenofibrate, gabapentin, levothyroxine, lisinopril, metformin, metoprolol tartrate, and topiramate.  Pharmacotherapy (Medications Ordered): Meds ordered this encounter  Medications   HYDROcodone-acetaminophen (NORCO/VICODIN) 5-325 MG tablet    Sig: Take 1-2 tablets by mouth daily as needed for moderate pain. For chronic pain syndrome    Dispense:  45 tablet    Refill:  0   HYDROcodone-acetaminophen (NORCO/VICODIN) 5-325 MG tablet    Sig: Take 1-2 tablets by mouth daily as needed for moderate pain. For chronic pain syndrome    Dispense:  45 tablet    Refill:  0   HYDROcodone-acetaminophen (NORCO/VICODIN) 5-325 MG tablet    Sig: Take 1-2 tablets by mouth daily as needed for moderate pain. For chronic pain syndrome    Dispense:  45 tablet    Refill:  0   traMADol (ULTRAM) 50 MG tablet    Sig: Take 1 tablet (50 mg total) by mouth every 12 (twelve) hours as needed for severe pain. Must last 30 days    Dispense:  60 tablet    Refill:  2    Chronic Pain: STOP  Act (Not applicable) Fill 1 day early if closed on refill date. Avoid benzodiazepines within 8 hours of opioids   Orders:  No orders of the defined types were placed in this encounter.  Follow-up plan:   Return in about 14 weeks (around 11/13/2022) for Medication Management, in person.  Recent Visits No visits were found meeting these conditions. Showing recent visits within past 90 days and meeting all other requirements Today's Visits Date Type Provider Dept  08/07/22 Office Visit Edward Jolly, MD [facility]  Showing today's visits and meeting all other requirements Future Appointments Date Type Provider Dept  11/04/22 Appointment Edward Jolly, MD [facility]  Showing future appointments within next 90 days and meeting all other requirements  I discussed the assessment and treatment plan with the patient. The patient was provided an opportunity to ask questions and all were answered. The patient agreed with the plan and demonstrated an understanding of the instructions.  Patient advised to call back or seek an in-person evaluation if the symptoms or condition worsens.  Duration of encounter: .  Note by: Edward Jolly, MD Date: 08/07/2022; Time: 3:46 PM

## 2022-08-20 DIAGNOSIS — N1831 Chronic kidney disease, stage 3a: Secondary | ICD-10-CM | POA: Diagnosis not present

## 2022-08-20 DIAGNOSIS — E039 Hypothyroidism, unspecified: Secondary | ICD-10-CM | POA: Diagnosis not present

## 2022-08-20 DIAGNOSIS — E1122 Type 2 diabetes mellitus with diabetic chronic kidney disease: Secondary | ICD-10-CM | POA: Diagnosis not present

## 2022-08-20 DIAGNOSIS — E782 Mixed hyperlipidemia: Secondary | ICD-10-CM | POA: Diagnosis not present

## 2022-08-20 DIAGNOSIS — I10 Essential (primary) hypertension: Secondary | ICD-10-CM | POA: Diagnosis not present

## 2022-10-28 DIAGNOSIS — E039 Hypothyroidism, unspecified: Secondary | ICD-10-CM | POA: Diagnosis not present

## 2022-10-28 DIAGNOSIS — N1831 Chronic kidney disease, stage 3a: Secondary | ICD-10-CM | POA: Diagnosis not present

## 2022-10-28 DIAGNOSIS — E1122 Type 2 diabetes mellitus with diabetic chronic kidney disease: Secondary | ICD-10-CM | POA: Diagnosis not present

## 2022-10-28 DIAGNOSIS — I10 Essential (primary) hypertension: Secondary | ICD-10-CM | POA: Diagnosis not present

## 2022-10-28 DIAGNOSIS — E1129 Type 2 diabetes mellitus with other diabetic kidney complication: Secondary | ICD-10-CM | POA: Diagnosis not present

## 2022-10-28 DIAGNOSIS — E782 Mixed hyperlipidemia: Secondary | ICD-10-CM | POA: Diagnosis not present

## 2022-11-04 ENCOUNTER — Ambulatory Visit
Payer: Medicare HMO | Attending: Student in an Organized Health Care Education/Training Program | Admitting: Student in an Organized Health Care Education/Training Program

## 2022-11-04 ENCOUNTER — Encounter: Payer: Self-pay | Admitting: Student in an Organized Health Care Education/Training Program

## 2022-11-04 DIAGNOSIS — Z981 Arthrodesis status: Secondary | ICD-10-CM | POA: Diagnosis not present

## 2022-11-04 DIAGNOSIS — M47817 Spondylosis without myelopathy or radiculopathy, lumbosacral region: Secondary | ICD-10-CM

## 2022-11-04 DIAGNOSIS — G894 Chronic pain syndrome: Secondary | ICD-10-CM

## 2022-11-04 DIAGNOSIS — I129 Hypertensive chronic kidney disease with stage 1 through stage 4 chronic kidney disease, or unspecified chronic kidney disease: Secondary | ICD-10-CM | POA: Diagnosis not present

## 2022-11-04 DIAGNOSIS — Z6838 Body mass index (BMI) 38.0-38.9, adult: Secondary | ICD-10-CM | POA: Diagnosis not present

## 2022-11-04 DIAGNOSIS — N1831 Chronic kidney disease, stage 3a: Secondary | ICD-10-CM | POA: Diagnosis not present

## 2022-11-04 DIAGNOSIS — E039 Hypothyroidism, unspecified: Secondary | ICD-10-CM | POA: Diagnosis not present

## 2022-11-04 DIAGNOSIS — E782 Mixed hyperlipidemia: Secondary | ICD-10-CM | POA: Diagnosis not present

## 2022-11-04 DIAGNOSIS — M961 Postlaminectomy syndrome, not elsewhere classified: Secondary | ICD-10-CM

## 2022-11-04 DIAGNOSIS — E1122 Type 2 diabetes mellitus with diabetic chronic kidney disease: Secondary | ICD-10-CM | POA: Diagnosis not present

## 2022-11-04 MED ORDER — TRAMADOL HCL 50 MG PO TABS: 50.0000 mg | ORAL_TABLET | Freq: Two times a day (BID) | ORAL | 2 refills | Status: DC | PRN

## 2022-11-04 MED ORDER — HYDROCODONE-ACETAMINOPHEN 5-325 MG PO TABS
1.0000 | ORAL_TABLET | Freq: Every day | ORAL | 0 refills | Status: DC | PRN
Start: 2023-01-19 — End: 2023-02-03

## 2022-11-04 MED ORDER — HYDROCODONE-ACETAMINOPHEN 5-325 MG PO TABS
1.0000 | ORAL_TABLET | Freq: Every day | ORAL | 0 refills | Status: AC | PRN
Start: 2022-11-20 — End: 2022-12-20

## 2022-11-04 MED ORDER — HYDROCODONE-ACETAMINOPHEN 5-325 MG PO TABS
1.0000 | ORAL_TABLET | Freq: Every day | ORAL | 0 refills | Status: AC | PRN
Start: 2022-12-20 — End: 2023-01-19

## 2022-11-04 NOTE — Progress Notes (Signed)
Nursing Pain Medication Assessment:  Safety precautions to be maintained throughout the outpatient stay will include: orient to surroundings, keep bed in low position, maintain call bell within reach at all times, provide assistance with transfer out of bed and ambulation.  Medication Inspection Compliance: Pill count conducted under aseptic conditions, in front of the patient. Neither the pills nor the bottle was removed from the patient's sight at any time. Once count was completed pills were immediately returned to the patient in their original bottle.  Medication #1: Hydrocodone/APAP Pill/Patch Count:  35 of 45 pills remain Pill/Patch Appearance: Markings consistent with prescribed medication Bottle Appearance: Standard pharmacy container. Clearly labeled. Filled Date: 07 / 02 / 2024 Last Medication intake:  Yesterday  Medication #2: Tramadol (Ultram) Pill/Patch Count:  45 of 60 pills remain Pill/Patch Appearance: Markings consistent with prescribed medication Bottle Appearance: Standard pharmacy container. Clearly labeled. Filled Date: 06 / 27 / 2024 Last Medication intake:  Today

## 2022-11-04 NOTE — Progress Notes (Signed)
PROVIDER NOTE: Information contained herein reflects review and annotations entered in association with encounter. Interpretation of such information and data should be left to medically-trained personnel. Information provided to patient can be located elsewhere in the medical record under "Patient Instructions". Document created using STT-dictation technology, any transcriptional errors that may result from process are unintentional.    Patient: Cynthia Dawson  Service Category: E/M  Provider: Edward Jolly, MD  DOB: December 12, 1945  DOS: 11/04/2022  Specialty: Interventional Pain Management  MRN: [id number]  Setting: Ambulatory outpatient  PCP: Marisue Ivan, MD  Type: Established Patient    Referring Provider: Marisue Ivan, MD  Location: Office  Delivery: Face-to-face     HPI  Cynthia Dawson, a 77 y.o. year old female, is here today because of her No primary diagnosis found.. Cynthia Dawson's primary complain today is Back Pain (Scoliosis titanium rods in place lumbar right side ) Last encounter: My last encounter with her was on 08/07/22 Pertinent problems: Cynthia Dawson has Chronic back pain; Chronic pain syndrome; CKD (chronic kidney disease) stage 3, GFR 30-59 ml/min (HCC); Lumbosacral spondylosis without myelopathy; Type 2 diabetes mellitus with kidney complication, without long-term current use of insulin (HCC); Postlaminectomy syndrome of lumbosacral region; Status post lumbar spinal fusion (T10-L4); Status post thoracic spinal fusion (T10-L4); and Pain management contract signed on their pertinent problem list. Pain Assessment: Severity of Chronic pain is reported as a 6 /10. Location: Back Lower, Right/into right hip. Onset: More than a month ago. Quality: Discomfort, Constant, Aching, Sharp. Timing: Constant. Modifying factor(s): medications, staying off feet. Vitals:  height is 4\' 11"  (1.499 m) and weight is 180 lb (81.6 kg). Her temporal temperature is 97.4 F (36.3 C) (abnormal). Her blood  pressure is 127/69 and her pulse is 74.   Reason for encounter:   No change in medical history since last visit.  Patient's pain is at baseline.  Patient continues multimodal pain regimen as prescribed.  States that it provides pain relief and improvement in functional status. Insomnia has improved  Pharmacotherapy Assessment  Analgesic:  - Norco 5/325 mg BID (never takes more than 2x a day) - Tramadol 50 mg BID (never takes more than 2x per day)      Monitoring: [place] PMP: PDMP reviewed during this encounter.       Pharmacotherapy: No side-effects or adverse reactions reported. Compliance: No problems identified. Effectiveness: Clinically acceptable.  Vernie Ammons, RN  11/04/2022  1:36 PM  Sign when Signing Visit Nursing Pain Medication Assessment:  Safety precautions to be maintained throughout the outpatient stay will include: orient to surroundings, keep bed in low position, maintain call bell within reach at all times, provide assistance with transfer out of bed and ambulation.  Medication Inspection Compliance: Pill count conducted under aseptic conditions, in front of the patient. Neither the pills nor the bottle was removed from the patient's sight at any time. Once count was completed pills were immediately returned to the patient in their original bottle.  Medication #1: Hydrocodone/APAP Pill/Patch Count:  35 of 45 pills remain Pill/Patch Appearance: Markings consistent with prescribed medication Bottle Appearance: Standard pharmacy container. Clearly labeled. Filled Date: 07 / 02 / 2024 Last Medication intake:  Yesterday  Medication #2: Tramadol (Ultram) Pill/Patch Count:  45 of 60 pills remain Pill/Patch Appearance: Markings consistent with prescribed medication Bottle Appearance: Standard pharmacy container. Clearly labeled. Filled Date: 06 / 27 / 2024 Last Medication intake:  Today       UDS:  Summary  Date  Value Ref Range Status  08/01/2021 Note  Final     Comment:    ==================================================================== [employer] Select 13 (MW) ==================================================================== Test                             Result       Flag       Units  Drug Present and Declared for Prescription Verification   Hydromorphone                  65           EXPECTED   ng/mg creat   Norhydrocodone                 201          EXPECTED   ng/mg creat    Hydromorphone and norhydrocodone are expected metabolites of    hydrocodone. Sources of hydrocodone are scheduled prescription    medications.  Hydromorphone is also available as a scheduled    prescription medication.    Tramadol                       >6024        EXPECTED   ng/mg creat   O-Desmethyltramadol            >6024        EXPECTED   ng/mg creat   N-Desmethyltramadol            841          EXPECTED   ng/mg creat    Source of tramadol is a prescription medication. O-desmethyltramadol    and N-desmethyltramadol are expected metabolites of tramadol.  Drug Absent but Declared for Prescription Verification   Hydrocodone                    Not Detected UNEXPECTED ng/mg creat    Hydrocodone is almost always present in patients taking this drug    consistently. Absence of hydrocodone could be due to lapse of time    since the last dose or unusual pharmacokinetics (rapid metabolism).  ==================================================================== Test                      Result    Flag   Units      Ref Range   Creatinine              83               mg/dL      >=40 ==================================================================== Declared Medications:  The flagging and interpretation on this report are based on the  following declared medications.  Unexpected results may arise from  inaccuracies in the declared medications.   **Note: The testing scope of this panel includes these medications:   Hydrocodone (Norco)  Tramadol (Ultram)    **Note: The testing scope of this panel does not include the  following reported medications:   Acetaminophen (Norco)  Aspirin  Atorvastatin (Lipitor)  Calcium  Diclofenac (Voltaren)  Fenofibrate (TriCor)  Gabapentin (Neurontin)  Levothyroxine (Synthroid)  Lisinopril (Zestril)  Metformin (Glucophage)  Metoprolol (Lopressor)  Nystatin (Mycostatin)  Topiramate (Topamax)  Vitamin D  Vitamin D3 ==================================================================== For clinical consultation, please call [phone]. ====================================================================      ROS  Constitutional: Denies any fever or chills Gastrointestinal: No reported hemesis, hematochezia, vomiting, or acute GI distress Musculoskeletal:  Low back  pain Neurological:  No reported episodes of acute onset apraxia, aphasia, dysarthria, agnosia, amnesia, paralysis, loss of coordination, or loss of consciousness  Medication Review  Calcium Carbonate-Vit D-Min, HYDROcodone-acetaminophen, Vitamin D3, aspirin EC, atorvastatin, diclofenac sodium, fenofibrate, gabapentin, levothyroxine, lisinopril, metFORMIN, metoprolol tartrate, nystatin cream, topiramate, and traMADol  History Review  Allergy: Cynthia Dawson is allergic to aspartame and phenylalanine, gemfibrozil, penicillins, and adhesive [tape]. Drug: Cynthia Dawson  reports no history of drug use. Alcohol:  reports no history of alcohol use. Tobacco:  reports that she has never smoked. She has never used smokeless tobacco. Social: Cynthia Dawson  reports that she has never smoked. She has never used smokeless tobacco. She reports that she does not drink alcohol and does not use drugs. Medical:  has a past medical history of Arthritis, Diabetes mellitus without complication (HCC), Heart murmur, Hypercholesteremia, Hypertension, Hypothyroidism, and Neuropathy. Surgical: Cynthia Dawson  has a past surgical history that includes Back surgery; Cesarean section;  Cholecystectomy; Abdominal hysterectomy; Cataract extraction w/PHACO (Right, 07/03/2015); Eye surgery; Colonoscopy with propofol (N/A, 06/06/2016); and Cataract extraction w/PHACO (Left, 03/01/2019). Family: family history includes Breast cancer in her paternal aunt.  Laboratory Chemistry Profile   Renal No results found for: "BUN", "CREATININE", "LABCREA", "BCR", "GFR", "GFRAA", "GFRNONAA", "LABVMA", "EPIRU", "EPINEPH24HUR", "NOREPRU", "NOREPI24HUR", "DOPARU", "DOPAM24HRUR"  Hepatic No results found for: "AST", "ALT", "ALBUMIN", "ALKPHOS", "HCVAB", "AMYLASE", "LIPASE", "AMMONIA"  Electrolytes No results found for: "NA", "K", "CL", "CALCIUM", "MG", "PHOS"  Bone No results found for: "VD25OH", "VD125OH2TOT", "UX3235TD3", "UK0254YH0", "25OHVITD1", "25OHVITD2", "25OHVITD3", "TESTOFREE", "TESTOSTERONE"  Inflammation (CRP: Acute Phase) (ESR: Chronic Phase) No results found for: "CRP", "ESRSEDRATE", "LATICACIDVEN"       Note: Above Lab results reviewed.  Recent Imaging Review  MM 3D SCREEN BREAST BILATERAL CLINICAL DATA:  Screening.  EXAM: DIGITAL SCREENING BILATERAL MAMMOGRAM WITH TOMO AND CAD  COMPARISON:  Previous exam(s).  ACR Breast Density Category b: There are scattered areas of fibroglandular density.  FINDINGS: There are no findings suspicious for malignancy. Images were processed with CAD.  IMPRESSION: No mammographic evidence of malignancy. A result letter of this screening mammogram will be mailed directly to the patient.  RECOMMENDATION: Screening mammogram in one year. (Code:SM-B-01Y)  BI-RADS CATEGORY  1: Negative.  Electronically Signed   By: Bary Richard M.D.   On: 08/12/2017 09:53 Note: Reviewed        Physical Exam  General appearance: Well nourished, well developed, and well hydrated. In no apparent acute distress Mental status: Alert, oriented x 3 (person, place, & time)       Respiratory: No evidence of acute respiratory distress Eyes: PERLA Vitals:  BP 127/69 (BP Location: Right Arm, Patient Position: Sitting, Cuff Size: Normal) Comment (Cuff Size): forearm  Pulse 74   Temp (!) 97.4 F (36.3 C) (Temporal)   Ht 4\' 11"  (1.499 m)   Wt 180 lb (81.6 kg)   BMI 36.36 kg/m  BMI: Estimated body mass index is 36.36 kg/m as calculated from the following:   Height as of this encounter: 4\' 11"  (1.499 m).   Weight as of this encounter: 180 lb (81.6 kg). Ideal: Patient must be at least 60 in tall to calculate ideal body weight    Lumbar Exam  Skin & Axial Inspection: Well healed scar from previous spine surgery detected Alignment: Asymmetric Functional ROM: Mechanically restricted ROM       Stability: No instability detected Muscle Tone/Strength: Functionally intact. No obvious neuro-muscular anomalies detected. Sensory (Neurological): Dermatomal pain pattern right hip/leg   Gait & Posture Assessment  Ambulation:  Unassisted Gait: Antalgic Posture: Difficulty standing up straight, due to pain    Lower Extremity Exam      Side: Right lower extremity   Side: Left lower extremity  Stability: No instability observed           Stability: No instability observed          Skin & Extremity Inspection: Skin color, temperature, and hair growth are WNL. No peripheral edema or cyanosis. No masses, redness, swelling, asymmetry, or associated skin lesions. No contractures.   Skin & Extremity Inspection: Skin color, temperature, and hair growth are WNL. No peripheral edema or cyanosis. No masses, redness, swelling, asymmetry, or associated skin lesions. No contractures.  Functional ROM: Pain restricted ROM for hip and knee joints           Functional ROM: Unrestricted ROM                  Muscle Tone/Strength: Functionally intact. No obvious neuro-muscular anomalies detected.   Muscle Tone/Strength: Functionally intact. No obvious neuro-muscular anomalies detected.  Sensory (Neurological): Dermatomal pain pattern medial portion of foot (L4)   Sensory  (Neurological): Unimpaired        DTR: Patellar: deferred today Achilles: deferred today Plantar: deferred today   DTR: Patellar: deferred today Achilles: deferred today Plantar: deferred today  Palpation: No palpable anomalies   Palpation: No palpable anomalies     Assessment   Diagnosis Status  1. Chronic pain syndrome   2. Lumbosacral spondylosis without myelopathy   3. Status post lumbar spinal fusion (T10-L4)   4. Postlaminectomy syndrome of lumbosacral region      Controlled Controlled Controlled   Plan of Care   Cynthia Dawson has a current medication list which includes the following long-term medication(s): atorvastatin, calcium carbonate-vit d-min, fenofibrate, gabapentin, levothyroxine, lisinopril, metformin, metoprolol tartrate, and topiramate.  Pharmacotherapy (Medications Ordered): Meds ordered this encounter  Medications   traMADol (ULTRAM) 50 MG tablet    Sig: Take 1 tablet (50 mg total) by mouth every 12 (twelve) hours as needed for severe pain. Must last 30 days    Dispense:  60 tablet    Refill:  2    Chronic Pain: STOP Act (Not applicable) Fill 1 day early if closed on refill date. Avoid benzodiazepines within 8 hours of opioids   HYDROcodone-acetaminophen (NORCO/VICODIN) 5-325 MG tablet    Sig: Take 1-2 tablets by mouth daily as needed for moderate pain. For chronic pain syndrome    Dispense:  45 tablet    Refill:  0   HYDROcodone-acetaminophen (NORCO/VICODIN) 5-325 MG tablet    Sig: Take 1-2 tablets by mouth daily as needed for moderate pain. For chronic pain syndrome    Dispense:  45 tablet    Refill:  0   HYDROcodone-acetaminophen (NORCO/VICODIN) 5-325 MG tablet    Sig: Take 1-2 tablets by mouth daily as needed for moderate pain. For chronic pain syndrome    Dispense:  45 tablet    Refill:  0   Orders:  Orders Placed This Encounter  Procedures   Tox[employer] Select 13 (MW), Urine    Volume: 30 ml(s). Minimum 3 ml of urine is  needed. Document temperature of fresh sample. Indications: Long term (current) use of opiate analgesic [phone])    Order Specific Question:   Release to patient    Answer:   Immediate   Follow-up plan:   Return in about 3 months (around 02/12/2023) for Medication Management, in person.    Recent  Visits Date Type Provider Dept  08/07/22 Office Visit Edward Jolly, MD [facility]  Showing recent visits within past 90 days and meeting all other requirements Today's Visits Date Type Provider Dept  11/04/22 Office Visit Edward Jolly, MD [facility]  Showing today's visits and meeting all other requirements Future Appointments No visits were found meeting these conditions. Showing future appointments within next 90 days and meeting all other requirements  I discussed the assessment and treatment plan with the patient. The patient was provided an opportunity to ask questions and all were answered. The patient agreed with the plan and demonstrated an understanding of the instructions.  Patient advised to call back or seek an in-person evaluation if the symptoms or condition worsens.  Duration of encounter: .  Note by: Edward Jolly, MD Date: 11/04/2022; Time: 1:48 PM

## 2022-11-07 LAB — TOXASSURE SELECT 13 (MW), URINE

## 2023-02-03 ENCOUNTER — Ambulatory Visit
Payer: Medicare HMO | Attending: Student in an Organized Health Care Education/Training Program | Admitting: Student in an Organized Health Care Education/Training Program

## 2023-02-03 ENCOUNTER — Encounter: Payer: Self-pay | Admitting: Student in an Organized Health Care Education/Training Program

## 2023-02-03 DIAGNOSIS — M961 Postlaminectomy syndrome, not elsewhere classified: Secondary | ICD-10-CM | POA: Diagnosis not present

## 2023-02-03 DIAGNOSIS — Z981 Arthrodesis status: Secondary | ICD-10-CM | POA: Diagnosis not present

## 2023-02-03 DIAGNOSIS — M47817 Spondylosis without myelopathy or radiculopathy, lumbosacral region: Secondary | ICD-10-CM | POA: Diagnosis not present

## 2023-02-03 DIAGNOSIS — G894 Chronic pain syndrome: Secondary | ICD-10-CM | POA: Insufficient documentation

## 2023-02-03 MED ORDER — HYDROCODONE-ACETAMINOPHEN 5-325 MG PO TABS
1.0000 | ORAL_TABLET | Freq: Every day | ORAL | 0 refills | Status: AC | PRN
Start: 2023-03-29 — End: 2023-04-28

## 2023-02-03 MED ORDER — TRAMADOL HCL 50 MG PO TABS: 50.0000 mg | ORAL_TABLET | Freq: Two times a day (BID) | ORAL | 2 refills | Status: DC | PRN

## 2023-02-03 MED ORDER — HYDROCODONE-ACETAMINOPHEN 5-325 MG PO TABS
1.0000 | ORAL_TABLET | Freq: Every day | ORAL | 0 refills | Status: DC | PRN
Start: 2023-04-28 — End: 2023-05-07

## 2023-02-03 MED ORDER — HYDROCODONE-ACETAMINOPHEN 5-325 MG PO TABS
1.0000 | ORAL_TABLET | Freq: Every day | ORAL | 0 refills | Status: AC | PRN
Start: 2023-02-27 — End: 2023-03-29

## 2023-02-03 NOTE — Progress Notes (Signed)
PROVIDER NOTE: Information contained herein reflects review and annotations entered in association with encounter. Interpretation of such information and data should be left to medically-trained personnel. Information provided to patient can be located elsewhere in the medical record under "Patient Instructions". Document created using STT-dictation technology, any transcriptional errors that may result from process are unintentional.    Patient: Cynthia Dawson  Service Category: E/M  Provider: Edward Jolly, MD  DOB: Nov 02, 1945  DOS: 02/03/2023  Specialty: Interventional Pain Management  MRN: [id number]  Setting: Ambulatory outpatient  PCP: Marisue Ivan, MD  Type: Established Patient    Referring Provider: Marisue Ivan, MD  Location: Office  Delivery: Face-to-face     HPI  [name], a 77 y.o. year old female, is here today because of her No primary diagnosis found.. [name]'s primary complain today is Back Pain Last encounter: My last encounter with her was on 11/04/22 Pertinent problems: [name] has Chronic back pain; Chronic pain syndrome; CKD (chronic kidney disease) stage 3, GFR 30-59 ml/min (HCC); Lumbosacral spondylosis without myelopathy; Type 2 diabetes mellitus with kidney complication, without long-term current use of insulin (HCC); Postlaminectomy syndrome of lumbosacral region; Status post lumbar spinal fusion (T10-L4); Status post thoracic spinal fusion (T10-L4); and Pain management contract signed on their pertinent problem list. Pain Assessment: Severity of Chronic pain is reported as a 6 /10. Location: Back Lower/to buttocks bilat. Onset: More than a month ago. Quality: Aching. Timing: Constant. Modifying factor(s): meds. Vitals:  height is 4\' 10"  (1.473 m) and weight is 180 lb (81.6 kg). Her temperature is 97.7 F (36.5 C). Her blood pressure is 136/63 and her pulse is 72. Her respiration is 16 and oxygen saturation is 97%.   Reason for encounter:   No change  in medical history since last visit.  Patient's pain is at baseline.  Patient continues multimodal pain regimen as prescribed.  States that it provides pain relief and improvement in functional status.   Pharmacotherapy Assessment  Analgesic:  - Norco 5/325 mg BID (never takes more than 2x a day) - Tramadol 50 mg BID (never takes more than 2x per day)      Monitoring: [place] PMP: PDMP reviewed during this encounter.       Pharmacotherapy: No side-effects or adverse reactions reported. Compliance: No problems identified. Effectiveness: Clinically acceptable.  Nonah Mattes, RN  02/03/2023  3:25 PM  Sign when Signing Visit Nursing Pain Medication Assessment:  Safety precautions to be maintained throughout the outpatient stay will include: orient to surroundings, keep bed in low position, maintain call bell within reach at all times, provide assistance with transfer out of bed and ambulation.  Medication Inspection Compliance: Pill count conducted under aseptic conditions, in front of the patient. Neither the pills nor the bottle was removed from the patient's sight at any time. Once count was completed pills were immediately returned to the patient in their original bottle.  Medication #1: Hydrocodone/APAP Pill/Patch Count:  41 of 45 pills remain Pill/Patch Appearance: Markings consistent with prescribed medication Bottle Appearance: Standard pharmacy container. Clearly labeled. Filled Date: 70 / 9 / 2024 Last Medication intake:  Yesterday  Medication #2: Tramadol (Ultram) Pill/Patch Count:  45 of 60 pills remain Pill/Patch Appearance: Markings consistent with prescribed medication Bottle Appearance: Standard pharmacy container. Clearly labeled. Filled Date: 66 / 25 / 2024 Last Medication intake:  Today       UDS:  Summary  Date Value Ref Range Status  11/04/2022 Note  Final  Comment:    ==================================================================== [employer]  (MW) ==================================================================== Test                             Result       Flag       Units  Drug Present and Declared for Prescription Verification   Norhydrocodone                 123          EXPECTED   ng/mg creat    Norhydrocodone is an expected metabolite of hydrocodone.    Tramadol                       >5155        EXPECTED   ng/mg creat   O-Desmethyltramadol            >5155        EXPECTED   ng/mg creat   N-Desmethyltramadol            807          EXPECTED   ng/mg creat    Source of tramadol is a prescription medication. O-desmethyltramadol    and N-desmethyltramadol are expected metabolites of tramadol.  Drug Absent but Declared for Prescription Verification   Hydrocodone                    Not Detected UNEXPECTED ng/mg creat    Hydrocodone is almost always present in patients taking this drug    consistently. Absence of hydrocodone could be due to lapse of time    since the last dose or unusual pharmacokinetics (rapid metabolism).  ==================================================================== Test                      Result    Flag   Units      Ref Range   Creatinine              97               mg/dL      >=65 ==================================================================== Declared Medications:  The flagging and interpretation on this report are based on the  following declared medications.  Unexpected results may arise from  inaccuracies in the declared medications.   **Note: The testing scope of this panel includes these medications:   Hydrocodone (Norco)  Tramadol (Ultram)   **Note: The testing scope of this panel does not include the  following reported medications:   Acetaminophen (Norco)  Aspirin  Atorvastatin (Lipitor)  Calcium  Diclofenac (Voltaren)  Fenofibrate (TriCor)  Gabapentin (Neurontin)  Levothyroxine (Synthroid)  Lisinopril (Zestril)  Metformin  Metoprolol (Lopressor)  Nystatin  (Mycostatin)  Topiramate (Topamax)  Vitamin D3 ==================================================================== For clinical consultation, please call [phone]. ====================================================================      ROS  Constitutional: Denies any fever or chills Gastrointestinal: No reported hemesis, hematochezia, vomiting, or acute GI distress Musculoskeletal:  Low back  pain Neurological: No reported episodes of acute onset apraxia, aphasia, dysarthria, agnosia, amnesia, paralysis, loss of coordination, or loss of consciousness  Medication Review  Calcium Carbonate-Vit D-Min, HYDROcodone-acetaminophen, Vitamin D3, aspirin EC, atorvastatin, diclofenac sodium, fenofibrate, gabapentin, levothyroxine, lisinopril, metFORMIN, metoprolol tartrate, nystatin cream, topiramate, and traMADol  History Review  Allergy: Ms. Saeteurn is allergic to aspartame and phenylalanine, gemfibrozil, penicillins, and adhesive [tape]. Drug: Ms. Miklas  reports no history of drug use. Alcohol:  reports no history  of alcohol use. Tobacco:  reports that she has never smoked. She has never used smokeless tobacco. Social: Ms. Leiss  reports that she has never smoked. She has never used smokeless tobacco. She reports that she does not drink alcohol and does not use drugs. Medical:  has a past medical history of Arthritis, Diabetes mellitus without complication (HCC), Heart murmur, Hypercholesteremia, Hypertension, Hypothyroidism, and Neuropathy. Surgical: Ms. Armenteros  has a past surgical history that includes Back surgery; Cesarean section; Cholecystectomy; Abdominal hysterectomy; Cataract extraction w/PHACO (Right, 07/03/2015); Eye surgery; Colonoscopy with propofol (N/A, 06/06/2016); and Cataract extraction w/PHACO (Left, 03/01/2019). Family: family history includes Breast cancer in her paternal aunt.  Laboratory Chemistry Profile   Renal No results found for: "BUN", "CREATININE", "LABCREA",  "BCR", "GFR", "GFRAA", "GFRNONAA", "LABVMA", "EPIRU", "EPINEPH24HUR", "NOREPRU", "NOREPI24HUR", "DOPARU", "DOPAM24HRUR"  Hepatic No results found for: "AST", "ALT", "ALBUMIN", "ALKPHOS", "HCVAB", "AMYLASE", "LIPASE", "AMMONIA"  Electrolytes No results found for: "NA", "K", "CL", "CALCIUM", "MG", "PHOS"  Bone No results found for: "VD25OH", "VD125OH2TOT", "TK1601UX3", "AT5573UK0", "25OHVITD1", "25OHVITD2", "25OHVITD3", "TESTOFREE", "TESTOSTERONE"  Inflammation (CRP: Acute Phase) (ESR: Chronic Phase) No results found for: "CRP", "ESRSEDRATE", "LATICACIDVEN"       Note: Above Lab results reviewed.  Recent Imaging Review  MM 3D SCREEN BREAST BILATERAL CLINICAL DATA:  Screening.  EXAM: DIGITAL SCREENING BILATERAL MAMMOGRAM WITH TOMO AND CAD  COMPARISON:  Previous exam(s).  ACR Breast Density Category b: There are scattered areas of fibroglandular density.  FINDINGS: There are no findings suspicious for malignancy. Images were processed with CAD.  IMPRESSION: No mammographic evidence of malignancy. A result letter of this screening mammogram will be mailed directly to the patient.  RECOMMENDATION: Screening mammogram in one year. (Code:SM-B-01Y)  BI-RADS CATEGORY  1: Negative.  Electronically Signed   By: Bary Richard M.D.   On: 08/12/2017 09:53 Note: Reviewed        Physical Exam  General appearance: Well nourished, well developed, and well hydrated. In no apparent acute distress Mental status: Alert, oriented x 3 (person, place, & time)       Respiratory: No evidence of acute respiratory distress Eyes: PERLA Vitals: BP 136/63   Pulse 72   Temp 97.7 F (36.5 C)   Resp 16   Ht 4\' 10"  (1.473 m)   Wt 180 lb (81.6 kg)   SpO2 97%   BMI 37.62 kg/m  BMI: Estimated body mass index is 37.62 kg/m as calculated from the following:   Height as of this encounter: 4\' 10"  (1.473 m).   Weight as of this encounter: 180 lb (81.6 kg). Ideal: Patient must be at least 60 in tall to  calculate ideal body weight    Lumbar Exam  Skin & Axial Inspection: Well healed scar from previous spine surgery detected Alignment: Asymmetric Functional ROM: Mechanically restricted ROM       Stability: No instability detected Muscle Tone/Strength: Functionally intact. No obvious neuro-muscular anomalies detected. Sensory (Neurological): Dermatomal pain pattern right hip/leg   Gait & Posture Assessment  Ambulation: Unassisted Gait: Antalgic Posture: Difficulty standing up straight, due to pain    Lower Extremity Exam      Side: Right lower extremity   Side: Left lower extremity  Stability: No instability observed           Stability: No instability observed          Skin & Extremity Inspection: Skin color, temperature, and hair growth are WNL. No peripheral edema or cyanosis. No masses, redness, swelling, asymmetry, or associated skin lesions.  No contractures.   Skin & Extremity Inspection: Skin color, temperature, and hair growth are WNL. No peripheral edema or cyanosis. No masses, redness, swelling, asymmetry, or associated skin lesions. No contractures.  Functional ROM: Pain restricted ROM for hip and knee joints           Functional ROM: Unrestricted ROM                  Muscle Tone/Strength: Functionally intact. No obvious neuro-muscular anomalies detected.   Muscle Tone/Strength: Functionally intact. No obvious neuro-muscular anomalies detected.  Sensory (Neurological): Dermatomal pain pattern medial portion of foot (L4)   Sensory (Neurological): Unimpaired        DTR: Patellar: deferred today Achilles: deferred today Plantar: deferred today   DTR: Patellar: deferred today Achilles: deferred today Plantar: deferred today  Palpation: No palpable anomalies   Palpation: No palpable anomalies     Assessment   Diagnosis Status  1. Chronic pain syndrome   2. Lumbosacral spondylosis without myelopathy   3. Status post lumbar spinal fusion (T10-L4)   4. Postlaminectomy  syndrome of lumbosacral region       Controlled Controlled Controlled   Plan of Care   Ms. Synthia Innocent Seider has a current medication list which includes the following long-term medication(s): atorvastatin, calcium carbonate-vit d-min, fenofibrate, gabapentin, levothyroxine, lisinopril, metformin, metoprolol tartrate, and topiramate.  Pharmacotherapy (Medications Ordered): Meds ordered this encounter  Medications   HYDROcodone-acetaminophen (NORCO/VICODIN) 5-325 MG tablet    Sig: Take 1-2 tablets by mouth daily as needed for moderate pain (pain score 4-6). For chronic pain syndrome    Dispense:  45 tablet    Refill:  0   traMADol (ULTRAM) 50 MG tablet    Sig: Take 1 tablet (50 mg total) by mouth every 12 (twelve) hours as needed for severe pain (pain score 7-10). Must last 30 days    Dispense:  60 tablet    Refill:  2    Chronic Pain: STOP Act (Not applicable) Fill 1 day early if closed on refill date. Avoid benzodiazepines within 8 hours of opioids   HYDROcodone-acetaminophen (NORCO/VICODIN) 5-325 MG tablet    Sig: Take 1-2 tablets by mouth daily as needed for moderate pain (pain score 4-6). For chronic pain syndrome    Dispense:  45 tablet    Refill:  0   HYDROcodone-acetaminophen (NORCO/VICODIN) 5-325 MG tablet    Sig: Take 1-2 tablets by mouth daily as needed for moderate pain (pain score 4-6). For chronic pain syndrome    Dispense:  45 tablet    Refill:  0   Orders:  No orders of the defined types were placed in this encounter.  Follow-up plan:   Return in about 14 weeks (around 05/12/2023) for MM, F2F.    Recent Visits No visits were found meeting these conditions. Showing recent visits within past 90 days and meeting all other requirements Today's Visits Date Type Provider Dept  02/03/23 Office Visit Edward Jolly, MD [facility]  Showing today's visits and meeting all other requirements Future Appointments No visits were found meeting these  conditions. Showing future appointments within next 90 days and meeting all other requirements  I discussed the assessment and treatment plan with the patient. The patient was provided an opportunity to ask questions and all were answered. The patient agreed with the plan and demonstrated an understanding of the instructions.  Patient advised to call back or seek an in-person evaluation if the symptoms or condition worsens.  Duration of  encounter: .  Note by: Edward Jolly, MD Date: 02/03/2023; Time: 3:27 PM

## 2023-02-03 NOTE — Progress Notes (Signed)
Nursing Pain Medication Assessment:  Safety precautions to be maintained throughout the outpatient stay will include: orient to surroundings, keep bed in low position, maintain call bell within reach at all times, provide assistance with transfer out of bed and ambulation.  Medication Inspection Compliance: Pill count conducted under aseptic conditions, in front of the patient. Neither the pills nor the bottle was removed from the patient's sight at any time. Once count was completed pills were immediately returned to the patient in their original bottle.  Medication #1: Hydrocodone/APAP Pill/Patch Count:  41 of 45 pills remain Pill/Patch Appearance: Markings consistent with prescribed medication Bottle Appearance: Standard pharmacy container. Clearly labeled. Filled Date: 82 / 9 / 2024 Last Medication intake:  Yesterday  Medication #2: Tramadol (Ultram) Pill/Patch Count:  45 of 60 pills remain Pill/Patch Appearance: Markings consistent with prescribed medication Bottle Appearance: Standard pharmacy container. Clearly labeled. Filled Date: 74 / 25 / 2024 Last Medication intake:  Today

## 2023-02-19 ENCOUNTER — Other Ambulatory Visit: Payer: Self-pay | Admitting: Student in an Organized Health Care Education/Training Program

## 2023-02-19 DIAGNOSIS — M47817 Spondylosis without myelopathy or radiculopathy, lumbosacral region: Secondary | ICD-10-CM

## 2023-02-19 DIAGNOSIS — M961 Postlaminectomy syndrome, not elsewhere classified: Secondary | ICD-10-CM

## 2023-02-19 DIAGNOSIS — G894 Chronic pain syndrome: Secondary | ICD-10-CM

## 2023-02-19 DIAGNOSIS — Z981 Arthrodesis status: Secondary | ICD-10-CM

## 2023-04-06 ENCOUNTER — Telehealth: Payer: Self-pay | Admitting: Student in an Organized Health Care Education/Training Program

## 2023-04-06 NOTE — Telephone Encounter (Signed)
Patient has been trying to get her scripts filled for Oxycodone and Tramadol. The pharmacy says they do not have any scripts for her to pick up. Please check with pharmacy and let patient

## 2023-04-06 NOTE — Telephone Encounter (Signed)
Called pharmacy.  They state they have the scripts and will get them filled and sent out.

## 2023-05-01 DIAGNOSIS — N1831 Chronic kidney disease, stage 3a: Secondary | ICD-10-CM | POA: Diagnosis not present

## 2023-05-01 DIAGNOSIS — E039 Hypothyroidism, unspecified: Secondary | ICD-10-CM | POA: Diagnosis not present

## 2023-05-01 DIAGNOSIS — E1122 Type 2 diabetes mellitus with diabetic chronic kidney disease: Secondary | ICD-10-CM | POA: Diagnosis not present

## 2023-05-01 DIAGNOSIS — E782 Mixed hyperlipidemia: Secondary | ICD-10-CM | POA: Diagnosis not present

## 2023-05-01 DIAGNOSIS — E1121 Type 2 diabetes mellitus with diabetic nephropathy: Secondary | ICD-10-CM | POA: Diagnosis not present

## 2023-05-07 ENCOUNTER — Encounter: Payer: Self-pay | Admitting: Student in an Organized Health Care Education/Training Program

## 2023-05-07 ENCOUNTER — Ambulatory Visit
Payer: Medicare HMO | Attending: Student in an Organized Health Care Education/Training Program | Admitting: Student in an Organized Health Care Education/Training Program

## 2023-05-07 DIAGNOSIS — G894 Chronic pain syndrome: Secondary | ICD-10-CM | POA: Diagnosis not present

## 2023-05-07 DIAGNOSIS — M47817 Spondylosis without myelopathy or radiculopathy, lumbosacral region: Secondary | ICD-10-CM | POA: Diagnosis not present

## 2023-05-07 DIAGNOSIS — M961 Postlaminectomy syndrome, not elsewhere classified: Secondary | ICD-10-CM | POA: Insufficient documentation

## 2023-05-07 DIAGNOSIS — Z981 Arthrodesis status: Secondary | ICD-10-CM | POA: Diagnosis not present

## 2023-05-07 MED ORDER — TRAMADOL HCL 50 MG PO TABS: 50.0000 mg | ORAL_TABLET | Freq: Two times a day (BID) | ORAL | 2 refills | Status: DC | PRN

## 2023-05-07 MED ORDER — HYDROCODONE-ACETAMINOPHEN 5-325 MG PO TABS: 1.0000 | ORAL_TABLET | Freq: Every day | ORAL | 0 refills | Status: AC | PRN

## 2023-05-07 MED ORDER — HYDROCODONE-ACETAMINOPHEN 5-325 MG PO TABS: 1.0000 | ORAL_TABLET | Freq: Every day | ORAL | 0 refills | Status: DC | PRN

## 2023-05-07 MED ORDER — GABAPENTIN 300 MG PO CAPS: 300.0000 mg | ORAL_CAPSULE | Freq: Two times a day (BID) | ORAL | 11 refills | Status: DC

## 2023-05-07 NOTE — Progress Notes (Signed)
Nursing Pain Medication Assessment:  Safety precautions to be maintained throughout the outpatient stay will include: orient to surroundings, keep bed in low position, maintain call bell within reach at all times, provide assistance with transfer out of bed and ambulation.  Medication Inspection Compliance: Pill count conducted under aseptic conditions, in front of the patient. Neither the pills nor the bottle was removed from the patient's sight at any time. Once count was completed pills were immediately returned to the patient in their original bottle.  Medication #1: Hydrocodone/APAP Pill/Patch Count:  17 of 45 pills remain Pill/Patch Appearance: Markings consistent with prescribed medication Bottle Appearance: Standard pharmacy container. Clearly labeled. Filled Date:12  / 17 / 2024 Last Medication intake:  Today  Medication #2: Tramadol (Ultram) Pill/Patch Count:  27 of 60 pills remain Pill/Patch Appearance: Markings consistent with prescribed medication Bottle Appearance: Standard pharmacy container. Clearly labeled. Filled Date: 46 / 20 / 2024 Last Medication intake:  Today

## 2023-05-07 NOTE — Progress Notes (Signed)
PROVIDER NOTE: Information contained herein reflects review and annotations entered in association with encounter. Interpretation of such information and data should be left to medically-trained personnel. Information provided to patient can be located elsewhere in the medical record under "Patient Instructions". Document created using STT-dictation technology, any transcriptional errors that may result from process are unintentional.    Patient: Cynthia Dawson  Service Category: E/M  Provider: Edward Jolly, MD  DOB: [birth year]  DOS: 05/07/2023  Specialty: Interventional Pain Management  MRN: [id number]  Setting: Ambulatory outpatient  PCP: Marisue Ivan, MD  Type: Established Patient    Referring Provider: Marisue Ivan, MD  Location: Office  Delivery: Face-to-face     HPI  Cynthia Dawson, a 78 y.o. year old female, is here today because of her No primary diagnosis found.. Cynthia Dawson's primary complain today is Back Pain (Entire back, s/p spinal surgery with rods and other hardware ) Last encounter: My last encounter with her was on 02/03/23 Pertinent problems: Cynthia Dawson has Chronic back pain; Chronic pain syndrome; CKD (chronic kidney disease) stage 3, GFR 30-59 ml/min (HCC); Lumbosacral spondylosis without myelopathy; Type 2 diabetes mellitus with kidney complication, without long-term current use of insulin (HCC); Postlaminectomy syndrome of lumbosacral region; Status post lumbar spinal fusion (T10-L4); Status post thoracic spinal fusion (T10-L4); and Pain management contract signed on their pertinent problem list. Pain Assessment: Severity of Chronic pain is reported as a 7 /10. Location: Back Upper, Mid, Lower, Right, Left/down the right leg to the toes. Onset: More than a month ago. Quality: Discomfort, Throbbing. Timing: Intermittent. Modifying factor(s): medications are helping and rest. Vitals:  height is 4\' 11"  (1.499 m) and weight is 180 lb (81.6 kg). Her temporal temperature is  97.2 F (36.2 C) (abnormal). Her blood pressure is 143/58 (abnormal) and her pulse is 71. Her respiration is 16 and oxygen saturation is 95%.   Reason for encounter:   No change in medical history since last visit.  Patient's pain is at baseline.  Patient continues multimodal pain regimen as prescribed.  States that it provides pain relief and improvement in functional status.   Pharmacotherapy Assessment  Analgesic:  - Norco 5/325 mg BID (never takes more than 2x a day) - Tramadol 50 mg BID (never takes more than 2x per day)      Monitoring: [place] PMP: PDMP reviewed during this encounter.       Pharmacotherapy: No side-effects or adverse reactions reported. Compliance: No problems identified. Effectiveness: Clinically acceptable.  Vernie Ammons, RN  05/07/2023  2:39 PM  Sign when Signing Visit Nursing Pain Medication Assessment:  Safety precautions to be maintained throughout the outpatient stay will include: orient to surroundings, keep bed in low position, maintain call bell within reach at all times, provide assistance with transfer out of bed and ambulation.  Medication Inspection Compliance: Pill count conducted under aseptic conditions, in front of the patient. Neither the pills nor the bottle was removed from the patient's sight at any time. Once count was completed pills were immediately returned to the patient in their original bottle.  Medication #1: Hydrocodone/APAP Pill/Patch Count:  17 of 45 pills remain Pill/Patch Appearance: Markings consistent with prescribed medication Bottle Appearance: Standard pharmacy container. Clearly labeled. Filled Date:12  / 17 / 2024 Last Medication intake:  Today  Medication #2: Tramadol (Ultram) Pill/Patch Count:  27 of 60 pills remain Pill/Patch Appearance: Markings consistent with prescribed medication Bottle Appearance: Standard pharmacy container. Clearly labeled. Filled Date: 83 / 20 / 2024  Last Medication intake:  Today        UDS:  Summary  Date Value Ref Range Status  11/04/2022 Note  Final    Comment:    ==================================================================== [employer] [facility] 13 (MW) ==================================================================== Test                             Result       Flag       Units  Drug Present and Declared for Prescription Verification   Norhydrocodone                 123          EXPECTED   ng/mg creat    Norhydrocodone is an expected metabolite of hydrocodone.    Tramadol                       >5155        EXPECTED   ng/mg creat   O-Desmethyltramadol            >5155        EXPECTED   ng/mg creat   N-Desmethyltramadol            807          EXPECTED   ng/mg creat    Source of tramadol is a prescription medication. O-desmethyltramadol    and N-desmethyltramadol are expected metabolites of tramadol.  Drug Absent but Declared for Prescription Verification   Hydrocodone                    Not Detected UNEXPECTED ng/mg creat    Hydrocodone is almost always present in patients taking this drug    consistently. Absence of hydrocodone could be due to lapse of time    since the last dose or unusual pharmacokinetics (rapid metabolism).  ==================================================================== Test                      Result    Flag   Units      Ref Range   Creatinine              97               mg/dL      >=40 ==================================================================== Declared Medications:  The flagging and interpretation on this report are based on the  following declared medications.  Unexpected results may arise from  inaccuracies in the declared medications.   **Note: The testing scope of this panel includes these medications:   Hydrocodone (Norco)  Tramadol (Ultram)   **Note: The testing scope of this panel does not include the  following reported medications:   Acetaminophen (Norco)  Aspirin  Atorvastatin  (Lipitor)  Calcium  Diclofenac (Voltaren)  Fenofibrate (TriCor)  Gabapentin (Neurontin)  Levothyroxine (Synthroid)  Lisinopril (Zestril)  Metformin  Metoprolol (Lopressor)  Nystatin (Mycostatin)  Topiramate (Topamax)  Vitamin D3 ==================================================================== For clinical consultation, please call [phone]. ====================================================================      ROS  Constitutional: Denies any fever or chills Gastrointestinal: No reported hemesis, hematochezia, vomiting, or acute GI distress Musculoskeletal:  Low back  pain Neurological: No reported episodes of acute onset apraxia, aphasia, dysarthria, agnosia, amnesia, paralysis, loss of coordination, or loss of consciousness  Medication Review  Calcium Carbonate-Vit D-Min, HYDROcodone-acetaminophen, Vitamin D3, aspirin EC, atorvastatin, diclofenac sodium, fenofibrate, gabapentin, levothyroxine, lisinopril, metFORMIN, metoprolol tartrate, nystatin cream, topiramate, and traMADol  History Review  Allergy:  Cynthia Dawson is allergic to aspartame and phenylalanine, gemfibrozil, penicillins, and adhesive [tape]. Drug: Cynthia Dawson  reports no history of drug use. Alcohol:  reports no history of alcohol use. Tobacco:  reports that she has never smoked. She has never used smokeless tobacco. Social: Cynthia Dawson  reports that she has never smoked. She has never used smokeless tobacco. She reports that she does not drink alcohol and does not use drugs. Medical:  has a past medical history of Arthritis, Diabetes mellitus without complication (HCC), Heart murmur, Hypercholesteremia, Hypertension, Hypothyroidism, and Neuropathy. Surgical: Cynthia Dawson  has a past surgical history that includes Back surgery; Cesarean section; Cholecystectomy; Abdominal hysterectomy; Cataract extraction w/PHACO (Right, 07/03/2015); Eye surgery; Colonoscopy with propofol (N/A, 06/06/2016); and Cataract extraction  w/PHACO (Left, 03/01/2019). Family: family history includes Breast cancer in her paternal aunt.  Laboratory Chemistry Profile   Renal No results found for: "BUN", "CREATININE", "LABCREA", "BCR", "GFR", "GFRAA", "GFRNONAA", "LABVMA", "EPIRU", "EPINEPH24HUR", "NOREPRU", "NOREPI24HUR", "DOPARU", "DOPAM24HRUR"  Hepatic No results found for: "AST", "ALT", "ALBUMIN", "ALKPHOS", "HCVAB", "AMYLASE", "LIPASE", "AMMONIA"  Electrolytes No results found for: "NA", "K", "CL", "CALCIUM", "MG", "PHOS"  Bone No results found for: "VD25OH", "VD125OH2TOT", "[id number]", "JY7829FA2", "25OHVITD1", "25OHVITD2", "25OHVITD3", "TESTOFREE", "TESTOSTERONE"  Inflammation (CRP: Acute Phase) (ESR: Chronic Phase) No results found for: "CRP", "ESRSEDRATE", "LATICACIDVEN"       Note: Above Lab results reviewed.  Recent Imaging Review  MM 3D SCREEN BREAST BILATERAL CLINICAL DATA:  Screening.  EXAM: DIGITAL SCREENING BILATERAL MAMMOGRAM WITH TOMO AND CAD  COMPARISON:  Previous exam(s).  ACR Breast Density Category b: There are scattered areas of fibroglandular density.  FINDINGS: There are no findings suspicious for malignancy. Images were processed with CAD.  IMPRESSION: No mammographic evidence of malignancy. A result letter of this screening mammogram will be mailed directly to the patient.  RECOMMENDATION: Screening mammogram in one year. (Code:SM-B-01Y)  BI-RADS CATEGORY  1: Negative.  Electronically Signed   By: Bary Richard M.D.   On: 08/12/2017 09:53 Note: Reviewed        Physical Exam  General appearance: Well nourished, well developed, and well hydrated. In no apparent acute distress Mental status: Alert, oriented x 3 (person, place, & time)       Respiratory: No evidence of acute respiratory distress Eyes: PERLA Vitals: BP (!) 143/58 (BP Location: Right Arm, Patient Position: Sitting, Cuff Size: Normal)   Pulse 71   Temp (!) 97.2 F (36.2 C) (Temporal)   Resp 16   Ht 4\' 11"  (1.499  m)   Wt 180 lb (81.6 kg)   SpO2 95%   BMI 36.36 kg/m  BMI: Estimated body mass index is 36.36 kg/m as calculated from the following:   Height as of this encounter: 4\' 11"  (1.499 m).   Weight as of this encounter: 180 lb (81.6 kg). Ideal: Patient must be at least 60 in tall to calculate ideal body weight    Lumbar Exam  Skin & Axial Inspection: Well healed scar from previous spine surgery detected Alignment: Asymmetric Functional ROM: Mechanically restricted ROM       Stability: No instability detected Muscle Tone/Strength: Functionally intact. No obvious neuro-muscular anomalies detected. Sensory (Neurological): Dermatomal pain pattern right hip/leg   Gait & Posture Assessment  Ambulation: Unassisted Gait: Antalgic Posture: Difficulty standing up straight, due to pain    Lower Extremity Exam      Side: Right lower extremity   Side: Left lower extremity  Stability: No instability observed  Stability: No instability observed          Skin & Extremity Inspection: Skin color, temperature, and hair growth are WNL. No peripheral edema or cyanosis. No masses, redness, swelling, asymmetry, or associated skin lesions. No contractures.   Skin & Extremity Inspection: Skin color, temperature, and hair growth are WNL. No peripheral edema or cyanosis. No masses, redness, swelling, asymmetry, or associated skin lesions. No contractures.  Functional ROM: Pain restricted ROM for hip and knee joints           Functional ROM: Unrestricted ROM                  Muscle Tone/Strength: Functionally intact. No obvious neuro-muscular anomalies detected.   Muscle Tone/Strength: Functionally intact. No obvious neuro-muscular anomalies detected.  Sensory (Neurological): Dermatomal pain pattern medial portion of foot (L4)   Sensory (Neurological): Unimpaired        DTR: Patellar: deferred today Achilles: deferred today Plantar: deferred today   DTR: Patellar: deferred today Achilles: deferred  today Plantar: deferred today  Palpation: No palpable anomalies   Palpation: No palpable anomalies     Assessment   Diagnosis Status  1. Chronic pain syndrome   2. Lumbosacral spondylosis without myelopathy   3. Status post lumbar spinal fusion (T10-L4)   4. Postlaminectomy syndrome of lumbosacral region    Controlled Controlled Controlled   Plan of Care   Cynthia Dawson has a current medication list which includes the following long-term medication(s): atorvastatin, calcium carbonate-vit d-min, fenofibrate, levothyroxine, lisinopril, metformin, metoprolol tartrate, topiramate, and gabapentin.  Pharmacotherapy (Medications Ordered): Meds ordered this encounter  Medications   traMADol (ULTRAM) 50 MG tablet    Sig: Take 1 tablet (50 mg total) by mouth every 12 (twelve) hours as needed for severe pain (pain score 7-10). Must last 30 days    Dispense:  60 tablet    Refill:  2    Chronic Pain: STOP Act (Not applicable) Fill 1 day early if closed on refill date. Avoid benzodiazepines within 8 hours of opioids   HYDROcodone-acetaminophen (NORCO/VICODIN) 5-325 MG tablet    Sig: Take 1-2 tablets by mouth daily as needed for moderate pain (pain score 4-6). For chronic pain syndrome    Dispense:  45 tablet    Refill:  0   HYDROcodone-acetaminophen (NORCO/VICODIN) 5-325 MG tablet    Sig: Take 1-2 tablets by mouth daily as needed for moderate pain (pain score 4-6). For chronic pain syndrome    Dispense:  45 tablet    Refill:  0   HYDROcodone-acetaminophen (NORCO/VICODIN) 5-325 MG tablet    Sig: Take 1-2 tablets by mouth daily as needed for moderate pain (pain score 4-6). For chronic pain syndrome    Dispense:  45 tablet    Refill:  0   gabapentin (NEURONTIN) 300 MG capsule    Sig: Take 1 capsule (300 mg total) by mouth 2 (two) times daily.    Dispense:  60 capsule    Refill:  11   Orders:  No orders of the defined types were placed in this encounter.  Follow-up plan:   Return  in about 3 months (around 08/05/2023) for MM, F2F.    Recent Visits No visits were found meeting these conditions. Showing recent visits within past 90 days and meeting all other requirements Today's Visits Date Type Provider Dept  05/07/23 Office Visit Edward Jolly, MD [facility]  Showing today's visits and meeting all other requirements Future Appointments No visits were found  meeting these conditions. Showing future appointments within next 90 days and meeting all other requirements  I discussed the assessment and treatment plan with the patient. The patient was provided an opportunity to ask questions and all were answered. The patient agreed with the plan and demonstrated an understanding of the instructions.  Patient advised to call back or seek an in-person evaluation if the symptoms or condition worsens.  Duration of encounter: .  Note by: Edward Jolly, MD Date: 05/07/2023; Time: 3:03 PM

## 2023-05-08 DIAGNOSIS — E119 Type 2 diabetes mellitus without complications: Secondary | ICD-10-CM | POA: Diagnosis not present

## 2023-05-08 DIAGNOSIS — E785 Hyperlipidemia, unspecified: Secondary | ICD-10-CM | POA: Diagnosis not present

## 2023-05-08 DIAGNOSIS — I10 Essential (primary) hypertension: Secondary | ICD-10-CM | POA: Diagnosis not present

## 2023-05-08 DIAGNOSIS — Z1331 Encounter for screening for depression: Secondary | ICD-10-CM | POA: Diagnosis not present

## 2023-05-08 DIAGNOSIS — Z6839 Body mass index (BMI) 39.0-39.9, adult: Secondary | ICD-10-CM | POA: Diagnosis not present

## 2023-05-08 DIAGNOSIS — Z0000 Encounter for general adult medical examination without abnormal findings: Secondary | ICD-10-CM | POA: Diagnosis not present

## 2023-05-08 DIAGNOSIS — E669 Obesity, unspecified: Secondary | ICD-10-CM | POA: Diagnosis not present

## 2023-06-23 ENCOUNTER — Other Ambulatory Visit: Payer: Self-pay | Admitting: *Deleted

## 2023-06-23 DIAGNOSIS — M961 Postlaminectomy syndrome, not elsewhere classified: Secondary | ICD-10-CM

## 2023-06-23 DIAGNOSIS — Z981 Arthrodesis status: Secondary | ICD-10-CM

## 2023-06-23 DIAGNOSIS — M47817 Spondylosis without myelopathy or radiculopathy, lumbosacral region: Secondary | ICD-10-CM

## 2023-06-23 DIAGNOSIS — G894 Chronic pain syndrome: Secondary | ICD-10-CM

## 2023-06-23 MED ORDER — TOPIRAMATE 50 MG PO TABS: 50.0000 mg | ORAL_TABLET | Freq: Two times a day (BID) | ORAL | 11 refills | Status: DC

## 2023-07-01 DIAGNOSIS — E039 Hypothyroidism, unspecified: Secondary | ICD-10-CM | POA: Diagnosis not present

## 2023-07-01 DIAGNOSIS — E782 Mixed hyperlipidemia: Secondary | ICD-10-CM | POA: Diagnosis not present

## 2023-07-01 DIAGNOSIS — N1831 Chronic kidney disease, stage 3a: Secondary | ICD-10-CM | POA: Diagnosis not present

## 2023-07-01 DIAGNOSIS — I10 Essential (primary) hypertension: Secondary | ICD-10-CM | POA: Diagnosis not present

## 2023-07-01 DIAGNOSIS — E1121 Type 2 diabetes mellitus with diabetic nephropathy: Secondary | ICD-10-CM | POA: Diagnosis not present

## 2023-07-30 ENCOUNTER — Encounter: Payer: Self-pay | Admitting: Student in an Organized Health Care Education/Training Program

## 2023-07-30 ENCOUNTER — Ambulatory Visit
Payer: Medicare HMO | Attending: Student in an Organized Health Care Education/Training Program | Admitting: Student in an Organized Health Care Education/Training Program

## 2023-07-30 VITALS — BP 139/60 | HR 62 | Temp 98.0°F | Resp 16 | Ht <= 58 in | Wt 184.0 lb

## 2023-07-30 DIAGNOSIS — Z981 Arthrodesis status: Secondary | ICD-10-CM | POA: Diagnosis not present

## 2023-07-30 DIAGNOSIS — M961 Postlaminectomy syndrome, not elsewhere classified: Secondary | ICD-10-CM | POA: Insufficient documentation

## 2023-07-30 DIAGNOSIS — M47817 Spondylosis without myelopathy or radiculopathy, lumbosacral region: Secondary | ICD-10-CM | POA: Insufficient documentation

## 2023-07-30 DIAGNOSIS — G894 Chronic pain syndrome: Secondary | ICD-10-CM | POA: Diagnosis not present

## 2023-07-30 MED ORDER — HYDROCODONE-ACETAMINOPHEN 5-325 MG PO TABS: 1.0000 | ORAL_TABLET | Freq: Every day | ORAL | 0 refills | Status: AC | PRN

## 2023-07-30 MED ORDER — TRAMADOL HCL 50 MG PO TABS: 50.0000 mg | ORAL_TABLET | Freq: Two times a day (BID) | ORAL | 2 refills | Status: DC | PRN

## 2023-07-30 MED ORDER — HYDROCODONE-ACETAMINOPHEN 5-325 MG PO TABS: 1.0000 | ORAL_TABLET | Freq: Every day | ORAL | 0 refills | Status: DC | PRN

## 2023-07-30 NOTE — Progress Notes (Deleted)
 PROVIDER NOTE: Interpretation of information contained herein should be left to medically-trained personnel. Specific patient instructions are provided elsewhere under "Patient Instructions" section of medical record. This document was created in part using AI and STT-dictation technology, any transcriptional errors that may result from this process are unintentional.  Patient: Cynthia Dawson  Service: E/M   PCP: Marisue Ivan, MD  DOB: [birth year]  DOS: 07/30/2023  Provider: Bettey Costa, NP  MRN: [id number]  Delivery: Face-to-face  Specialty: Interventional Pain Management  Type: Established Patient  Setting: Ambulatory outpatient facility  Specialty designation: 09  Referring Prov.: Marisue Ivan, MD  Location: Outpatient office facility       HPI  Cynthia Dawson, a 78 y.o. year old female, is here today because of her No primary diagnosis found.. Ms. Bleau's primary complain today is Back Pain (lower) and Hip Pain (right)  Pertinent problems: Ms. Stalnaker does not have any pertinent problems on file. Pain Assessment: Severity of Chronic pain is reported as a 5 /10. Location: Back Lower/right leg to above the knee. Onset: More than a month ago. Quality: Aching. Timing: Constant. Modifying factor(s): lying flat. Vitals:  height is 4\' 10"  (1.473 m) and weight is 184 lb (83.5 kg). Her temporal temperature is 98 F (36.7 C). Her blood pressure is 139/60 and her pulse is 62. Her respiration is 16 and oxygen saturation is 98%.  BMI: Estimated body mass index is 38.46 kg/m as calculated from the following:   Height as of this encounter: 4\' 10"  (1.473 m).   Weight as of this encounter: 184 lb (83.5 kg). Last encounter: 05/07/2023. Last procedure: Visit date not found.  Reason for encounter: medication management. ***  Discussed the use of AI scribe software for clinical note transcription with the patient, who gave verbal consent to proceed.  History of Present Illness            Pharmacotherapy Assessment  Analgesic: {There is no content from the last Subjective section.}   Monitoring: [place] PMP: PDMP reviewed during this encounter. {Blank single:19197::"Unable to conduct review of the controlled substance reporting system due to technological failure.","     "} Pharmacotherapy: {Blank single:19197::"Opioid-induced constipation (OIC)(K59.03, T40.2X5A)","No side-effects or adverse reactions reported."} Compliance: {Blank single:19197::"Medication agreement violation - unsanctioned acquisition/use of additional opioid-containing medication","No problems identified."} Effectiveness: {Blank single:19197::"Clinically acceptable."}  Concepcion Elk, RN  07/30/2023  2:28 PM  Sign when Signing Visit  Nursing Pain Medication Assessment:  Safety precautions to be maintained throughout the outpatient stay will include: orient to surroundings, keep bed in low position, maintain call bell within reach at all times, provide assistance with transfer out of bed and ambulation.  Medication Inspection Compliance: Pill count conducted under aseptic conditions, in front of the patient. Neither the pills nor the bottle was removed from the patient's sight at any time. Once count was completed pills were immediately returned to the patient in their original bottle.  Medication #1: Tramadol (Ultram) Pill/Patch Count:  25 of 60 pills remain Pill/Patch Appearance: Markings consistent with prescribed medication Bottle Appearance: Standard pharmacy container. Clearly labeled. Filled Date: 03 / 05 / 2025 Last Medication intake:  Today  Medication #2: Hydrocodone/APAP Pill/Patch Count:  24 of 45 pills remain Pill/Patch Appearance: Markings consistent with prescribed medication Bottle Appearance: Standard pharmacy container. Clearly labeled. Filled Date:  0 3 / 2 / 2025 Last Medication intake:  Yesterday    No results found for: "CBDTHCR" No results found for: "D8THCCBX" No results  found for: "D9THCCBX"  UDS:  Summary  Date Value Ref Range Status  11/04/2022 Note  Final    Comment:    ==================================================================== [facility] Select 13 (MW) ==================================================================== Test                             Result       Flag       Units  Drug Present and Declared for Prescription Verification   Norhydrocodone                 123          EXPECTED   ng/mg creat    Norhydrocodone is an expected metabolite of hydrocodone.    Tramadol                       >5155        EXPECTED   ng/mg creat   O-Desmethyltramadol            >5155        EXPECTED   ng/mg creat   N-Desmethyltramadol            807          EXPECTED   ng/mg creat    Source of tramadol is a prescription medication. O-desmethyltramadol    and N-desmethyltramadol are expected metabolites of tramadol.  Drug Absent but Declared for Prescription Verification   Hydrocodone                    Not Detected UNEXPECTED ng/mg creat    Hydrocodone is almost always present in patients taking this drug    consistently. Absence of hydrocodone could be due to lapse of time    since the last dose or unusual pharmacokinetics (rapid metabolism).  ==================================================================== Test                      Result    Flag   Units      Ref Range   Creatinine              97               mg/dL      >=19 ==================================================================== Declared Medications:  The flagging and interpretation on this report are based on the  following declared medications.  Unexpected results may arise from  inaccuracies in the declared medications.   **Note: The testing scope of this panel includes these medications:   Hydrocodone (Norco)  Tramadol (Ultram)   **Note: The testing scope of this panel does not include the  following reported medications:   Acetaminophen (Norco)  Aspirin   Atorvastatin (Lipitor)  Calcium  Diclofenac (Voltaren)  Fenofibrate (TriCor)  Gabapentin (Neurontin)  Levothyroxine (Synthroid)  Lisinopril (Zestril)  Metformin  Metoprolol (Lopressor)  Nystatin (Mycostatin)  Topiramate (Topamax)  Vitamin D3 ==================================================================== For clinical consultation, please call [phone]. ====================================================================       ROS  Constitutional: {Blank single:19197::"Denies any fever or chills"} Gastrointestinal: {Blank single:19197::"No reported hemesis, hematochezia, vomiting, or acute GI distress"} Musculoskeletal: {Blank single:19197::"Denies any acute onset joint swelling, redness, loss of ROM, or weakness"} Neurological: {Blank single:19197::"No reported episodes of acute onset apraxia, aphasia, dysarthria, agnosia, amnesia, paralysis, loss of coordination, or loss of consciousness"}  Medication Review  Calcium Carbonate-Vit D-Min, HYDROcodone-acetaminophen, Vitamin D3, aspirin EC, atorvastatin, diclofenac sodium, fenofibrate, gabapentin, levothyroxine, lisinopril, metFORMIN, metoprolol tartrate, nystatin cream, topiramate, and traMADol  History Review  Allergy:  Ms. Blowe is allergic to aspartame and phenylalanine, gemfibrozil, penicillins, and adhesive [tape]. Drug: Ms. Mensch  reports no history of drug use. Alcohol:  reports no history of alcohol use. Tobacco:  reports that she has never smoked. She has never used smokeless tobacco. Social: Ms. Nonaka  reports that she has never smoked. She has never used smokeless tobacco. She reports that she does not drink alcohol and does not use drugs. Medical:  has a past medical history of Arthritis, Diabetes mellitus without complication (HCC), Heart murmur, Hypercholesteremia, Hypertension, Hypothyroidism, and Neuropathy. Surgical: Ms. Charbonneau  has a past surgical history that includes Back surgery; Cesarean section;  Cholecystectomy; Abdominal hysterectomy; Cataract extraction w/PHACO (Right, 07/03/2015); Eye surgery; Colonoscopy with propofol (N/A, 06/06/2016); and Cataract extraction w/PHACO (Left, 03/01/2019). Family: family history includes Breast cancer in her paternal aunt.  Laboratory Chemistry Profile   Renal No results found for: "BUN", "CREATININE", "LABCREA", "BCR", "GFR", "GFRAA", "GFRNONAA", "LABVMA", "EPIRU", "EPINEPH24HUR", "NOREPRU", "NOREPI24HUR", "DOPARU", "DOPAM24HRUR"  Hepatic No results found for: "AST", "ALT", "ALBUMIN", "ALKPHOS", "HCVAB", "AMYLASE", "LIPASE", "AMMONIA"  Electrolytes No results found for: "NA", "K", "CL", "CALCIUM", "MG", "PHOS"  Bone No results found for: "VD25OH", "VD125OH2TOT", "ZO1096EA5", "WU9811BJ4", "25OHVITD1", "25OHVITD2", "25OHVITD3", "TESTOFREE", "TESTOSTERONE"  Inflammation (CRP: Acute Phase) (ESR: Chronic Phase) No results found for: "CRP", "ESRSEDRATE", "LATICACIDVEN"       Note: {Blank single:19197::"Ms. Brueggemann indicates labs done and monitored by primary care practitioner using a non-[facility] EMR system","No results found under the [facility] electronic medical record","Results made available to patient.","Lab results reviewed and made available to patient.","Lab results reviewed and explained to patient in Layman's terms.","Above Lab results reviewed."}  Recent Imaging Review  MM 3D SCREEN BREAST BILATERAL CLINICAL DATA:  Screening.  EXAM: DIGITAL SCREENING BILATERAL MAMMOGRAM WITH TOMO AND CAD  COMPARISON:  Previous exam(s).  ACR Breast Density Category b: There are scattered areas of fibroglandular density.  FINDINGS: There are no findings suspicious for malignancy. Images were processed with CAD.  IMPRESSION: No mammographic evidence of malignancy. A result letter of this screening mammogram will be mailed directly to the patient.  RECOMMENDATION: Screening mammogram in one year. (Code:SM-B-01Y)  BI-RADS CATEGORY  1:  Negative.  Electronically Signed   By: Bary Richard M.D.   On: 08/12/2017 09:53 Note: {Blank single:19197::"No new results found.","No results found under the [facility] electronic medical record.","Imaging results reviewed and explained to patient in Layman's terms.","Results of ordered imaging test(s) reviewed and explained to patient in Layman's terms.","Imaging results reviewed.","Reviewed"} {Blank single:19197::"Results visible under [facility].","Results visible under [facility].","Results visible under [facility].","Results visible under [facility].","Results visible under "Care Everywhere".","Results made available to patient.","Copy of results provided to patient.","     "}  Physical Exam  General appearance: {general exam:210120802::"Well nourished, well developed, and well hydrated. In no apparent acute distress"} Mental status: {Blank single:19197::"Alert and oriented x 3. Exaggerated physical and/or psychosocial pain behavior perceived.","Alert, oriented x 3 (person, place, & time)"} {Blank single:19197::"Ms. Milley's speech pattern and demeanor seems to suggest oversedation","     "} Respiratory: {Blank single:19197::"Oxygen-dependent COPD","No evidence of acute respiratory distress"} Eyes: {Blank single:19197::"Miotic (pupilary constriction) due to opiate use","Midriatic","Anisocoric","Evidence of ptosis","Pin-point pupils","PERLA"} Vitals: BP 139/60 (Cuff Size: Normal)   Pulse 62   Temp 98 F (36.7 C) (Temporal)   Resp 16   Ht 4\' 10"  (1.473 m)   Wt 184 lb (83.5 kg)   SpO2 98%   BMI 38.46 kg/m  BMI: Estimated body mass index is 38.46 kg/m as calculated from the following:   Height  as of this encounter: 4\' 10"  (1.473 m).   Weight as of this encounter: 184 lb (83.5 kg). Ideal: Patient must be at least 60 in tall to calculate ideal body weight  Assessment   Diagnosis Status  1. Chronic pain syndrome   2. Lumbosacral spondylosis without myelopathy   3. Status post lumbar  spinal fusion (T10-L4)   4. Postlaminectomy syndrome of lumbosacral region    {Problem Stability:19197::"Deteriorating","Having a Flare-up","Improved","Improving","Not improving","Not responding","Persistent","Recurring","Reoccurring","Resolved","Responding","Stable","Unchanged","Unimproved","Worsened","Worsening","Controlled"} {Problem Stability:19197::"Deteriorating","Having a Flare-up","Improved","Improving","Not improving","Not responding","Persistent","Recurring","Reoccurring","Resolved","Responding","Stable","Unchanged","Unimproved","Worsened","Worsening","Controlled"} {Problem Stability:19197::"Deteriorating","Having a Flare-up","Improved","Improving","Not improving","Not responding","Persistent","Recurring","Reoccurring","Resolved","Responding","Stable","Unchanged","Unimproved","Worsened","Worsening","Controlled"}   Updated Problems: No problems updated.  Plan of Care  Problem-specific:  Assessment and Plan            Ms. ELANY FELIX has a current medication list which includes the following long-term medication(s): atorvastatin, fenofibrate, gabapentin, levothyroxine, lisinopril, metformin, metoprolol tartrate, topiramate, and calcium carbonate-vit d-min.  Pharmacotherapy (Medications Ordered): Meds ordered this encounter  Medications   HYDROcodone-acetaminophen (NORCO/VICODIN) 5-325 MG tablet    Sig: Take 1-2 tablets by mouth daily as needed for moderate pain (pain score 4-6). For chronic pain syndrome    Dispense:  45 tablet    Refill:  0   HYDROcodone-acetaminophen (NORCO/VICODIN) 5-325 MG tablet    Sig: Take 1-2 tablets by mouth daily as needed for moderate pain (pain score 4-6). For chronic pain syndrome    Dispense:  45 tablet    Refill:  0   HYDROcodone-acetaminophen (NORCO/VICODIN) 5-325 MG tablet    Sig: Take 1-2 tablets by mouth daily as needed for moderate pain (pain score 4-6). For chronic pain syndrome    Dispense:  45 tablet    Refill:  0   traMADol (ULTRAM)  50 MG tablet    Sig: Take 1 tablet (50 mg total) by mouth every 12 (twelve) hours as needed for severe pain (pain score 7-10). Must last 30 days    Dispense:  60 tablet    Refill:  2    Chronic Pain: STOP Act (Not applicable) Fill 1 day early if closed on refill date. Avoid benzodiazepines within 8 hours of opioids   Orders:  No orders of the defined types were placed in this encounter.  Follow-up plan:   Return in about 3 months (around 10/29/2023) for (F2F), (MM), Randalyn Rhea NP.     {There is no content from the last Plan section.}   Recent Visits Date Type Provider Dept  05/07/23 Office Visit Edward Jolly, MD [facility]  Showing recent visits within past 90 days and meeting all other requirements Today's Visits Date Type Provider Dept  07/30/23 Office Visit Edward Jolly, MD [facility]  Showing today's visits and meeting all other requirements Future Appointments Date Type Provider Dept  10/22/23 Appointment Bettey Costa, NP [facility]  Showing future appointments within next 90 days and meeting all other requirements  I discussed the assessment and treatment plan with the patient. The patient was provided an opportunity to ask questions and all were answered. The patient agreed with the plan and demonstrated an understanding of the instructions.  Patient advised to call back or seek an in-person evaluation if the symptoms or condition worsens.  Duration of encounter: *** minutes.  Total time on encounter, as per AMA guidelines included both the face-to-face and non-face-to-face time personally spent by the physician and/or other qualified health care professional(s) on the day of the encounter (includes time in activities that require the physician or other qualified health care professional and does not include time in  activities normally performed by clinical staff). Physician's time may include the following activities when  performed: Preparing to see the patient (e.g., pre-charting review of records, searching for previously ordered imaging, lab work, and nerve conduction tests) Review of prior analgesic pharmacotherapies. Reviewing PMP Interpreting ordered tests (e.g., lab work, imaging, nerve conduction tests) Performing post-procedure evaluations, including interpretation of diagnostic procedures Obtaining and/or reviewing separately obtained history Performing a medically appropriate examination and/or evaluation Counseling and educating the patient/family/caregiver Ordering medications, tests, or procedures Referring and communicating with other health care professionals (when not separately reported) Documenting clinical information in the electronic or other health record Independently interpreting results (not separately reported) and communicating results to the patient/ family/caregiver Care coordination (not separately reported)  Note by: Bettey Costa, NP (TTS and AI technology used. I apologize for any typographical errors that were not detected and corrected.) Date: 07/30/2023; Time: 2:52 PM

## 2023-07-30 NOTE — Progress Notes (Addendum)
 PROVIDER NOTE: Information contained herein reflects review and annotations entered in association with encounter. Interpretation of such information and data should be left to medically-trained personnel. Information provided to patient can be located elsewhere in the medical record under "Patient Instructions". Document created using STT-dictation technology, any transcriptional errors that may result from process are unintentional.    Patient: Cynthia Dawson  Service Category: E/M  Provider: Edward Jolly, MD  DOB: [birth year]  DOS: 07/30/2023  Specialty: Interventional Pain Management  MRN: [id number]  Setting: Ambulatory outpatient  PCP: Marisue Ivan, MD  Type: Established Patient    Referring Provider: Marisue Ivan, MD  Location: Office  Delivery: Face-to-face     HPI  [name], a 78 y.o. year old female, is here today because of her Chronic pain syndrome [G89.4]. [name] primary complain today is Back Pain (lower) and Hip Pain (right) Last encounter: My last encounter with her was on 05/07/23 Pertinent problems: [name] has Chronic back pain; Chronic pain syndrome; CKD (chronic kidney disease) stage 3, GFR 30-59 ml/min (HCC); Lumbosacral spondylosis without myelopathy; Type 2 diabetes mellitus with kidney complication, without long-term current use of insulin (HCC); Postlaminectomy syndrome of lumbosacral region; Status post lumbar spinal fusion (T10-L4); Status post thoracic spinal fusion (T10-L4); and Pain management contract signed on their pertinent problem list. Pain Assessment: Severity of Chronic pain is reported as a 5 /10. Location: Back Lower/right leg to above the knee. Onset: More than a month ago. Quality: Aching. Timing: Constant. Modifying factor(s): lying flat. Vitals:  height is 4\' 10"  (1.473 m) and weight is 184 lb (83.5 kg). Her temporal temperature is 98 F (36.7 C). Her blood pressure is 139/60 and her pulse is 62. Her respiration is 16 and oxygen  saturation is 98%.   Reason for encounter:   No change in medical history since last visit.  Patient's pain is at baseline.  Patient continues multimodal pain regimen as prescribed.  States that it provides pain relief and improvement in functional status.   Pharmacotherapy Assessment  Analgesic:  - Norco 5/325 mg BID (never takes more than 2x a day) - Tramadol 50 mg BID (never takes more than 2x per day)      Monitoring: [place] PMP: PDMP reviewed during this encounter.       Pharmacotherapy: No side-effects or adverse reactions reported. Compliance: No problems identified. Effectiveness: Clinically acceptable.  Cynthia Elk, RN  07/30/2023  4:03 PM  Signed Nursing Pain Medication Assessment:  Safety precautions to be maintained throughout the outpatient stay will include: orient to surroundings, keep bed in low position, maintain call bell within reach at all times, provide assistance with transfer out of bed and ambulation.  Medication Inspection Compliance: Pill count conducted under aseptic conditions, in front of the patient. Neither the pills nor the bottle was removed from the patient's sight at any time. Once count was completed pills were immediately returned to the patient in their original bottle.  Medication #1: Tramadol (Ultram) Pill/Patch Count:  25 of 60 pills remain Pill/Patch Appearance: Markings consistent with prescribed medication Bottle Appearance: Standard pharmacy container. Clearly labeled. Filled Date: 03 / 05 / 2025 Last Medication intake:  Today  Medication #2: Hydrocodone/APAP Pill/Patch Count:  24 of 45 pills remain Pill/Patch Appearance: Markings consistent with prescribed medication Bottle Appearance: Standard pharmacy container. Clearly labeled. Filled Date:  0 3 / 39 / 2025 Last Medication intake:  Yesterday       UDS:  Summary  Date Value Ref  Range Status  11/04/2022 Note  Final    Comment:     ==================================================================== [employer] [facility] (MW) ==================================================================== Test                             Result       Flag       Units  Drug Present and Declared for Prescription Verification   Norhydrocodone                 123          EXPECTED   ng/mg creat    Norhydrocodone is an expected metabolite of hydrocodone.    Tramadol                       >5155        EXPECTED   ng/mg creat   O-Desmethyltramadol            >5155        EXPECTED   ng/mg creat   N-Desmethyltramadol            807          EXPECTED   ng/mg creat    Source of tramadol is a prescription medication. O-desmethyltramadol    and N-desmethyltramadol are expected metabolites of tramadol.  Drug Absent but Declared for Prescription Verification   Hydrocodone                    Not Detected UNEXPECTED ng/mg creat    Hydrocodone is almost always present in patients taking this drug    consistently. Absence of hydrocodone could be due to lapse of time    since the last dose or unusual pharmacokinetics (rapid metabolism).  ==================================================================== Test                      Result    Flag   Units      Ref Range   Creatinine              97               mg/dL      >=40 ==================================================================== Declared Medications:  The flagging and interpretation on this report are based on the  following declared medications.  Unexpected results may arise from  inaccuracies in the declared medications.   **Note: The testing scope of this panel includes these medications:   Hydrocodone (Norco)  Tramadol (Ultram)   **Note: The testing scope of this panel does not include the  following reported medications:   Acetaminophen (Norco)  Aspirin  Atorvastatin (Lipitor)  Calcium  Diclofenac (Voltaren)  Fenofibrate (TriCor)  Gabapentin (Neurontin)   Levothyroxine (Synthroid)  Lisinopril (Zestril)  Metformin  Metoprolol (Lopressor)  Nystatin (Mycostatin)  Topiramate (Topamax)  Vitamin D3 ==================================================================== For clinical consultation, please call [phone]. ====================================================================      ROS  Constitutional: Denies any fever or chills Gastrointestinal: No reported hemesis, hematochezia, vomiting, or acute GI distress Musculoskeletal:  Low back  pain Neurological: No reported episodes of acute onset apraxia, aphasia, dysarthria, agnosia, amnesia, paralysis, loss of coordination, or loss of consciousness  Medication Review  Calcium Carbonate-Vit D-Min, HYDROcodone-acetaminophen, Vitamin D3, aspirin EC, atorvastatin, diclofenac sodium, fenofibrate, gabapentin, levothyroxine, lisinopril, metFORMIN, metoprolol tartrate, nystatin cream, topiramate, and traMADol  History Review  Allergy: Ms. Osorto is allergic to aspartame and phenylalanine, gemfibrozil, penicillins, and adhesive [tape]. Drug: Ms. Recendiz  reports  no history of drug use. Alcohol:  reports no history of alcohol use. Tobacco:  reports that she has never smoked. She has never used smokeless tobacco. Social: Ms. Boeve  reports that she has never smoked. She has never used smokeless tobacco. She reports that she does not drink alcohol and does not use drugs. Medical:  has a past medical history of Arthritis, Diabetes mellitus without complication (HCC), Heart murmur, Hypercholesteremia, Hypertension, Hypothyroidism, and Neuropathy. Surgical: Ms. Goldinger  has a past surgical history that includes Back surgery; Cesarean section; Cholecystectomy; Abdominal hysterectomy; Cataract extraction w/PHACO (Right, 07/03/2015); Eye surgery; Colonoscopy with propofol (N/A, 06/06/2016); and Cataract extraction w/PHACO (Left, 03/01/2019). Family: family history includes Breast cancer in her paternal  aunt.  Laboratory Chemistry Profile   Renal No results found for: "BUN", "CREATININE", "LABCREA", "BCR", "GFR", "GFRAA", "GFRNONAA", "LABVMA", "EPIRU", "EPINEPH24HUR", "NOREPRU", "NOREPI24HUR", "DOPARU", "DOPAM24HRUR"  Hepatic No results found for: "AST", "ALT", "ALBUMIN", "ALKPHOS", "HCVAB", "AMYLASE", "LIPASE", "AMMONIA"  Electrolytes No results found for: "NA", "K", "CL", "CALCIUM", "MG", "PHOS"  Bone No results found for: "VD25OH", "VD125OH2TOT", "ZO1096EA5", "WU9811BJ4", "25OHVITD1", "25OHVITD2", "25OHVITD3", "TESTOFREE", "TESTOSTERONE"  Inflammation (CRP: Acute Phase) (ESR: Chronic Phase) No results found for: "CRP", "ESRSEDRATE", "LATICACIDVEN"       Note: Above Lab results reviewed.  Recent Imaging Review  MM 3D SCREEN BREAST BILATERAL CLINICAL DATA:  Screening.  EXAM: DIGITAL SCREENING BILATERAL MAMMOGRAM WITH TOMO AND CAD  COMPARISON:  Previous exam(s).  ACR Breast Density Category b: There are scattered areas of fibroglandular density.  FINDINGS: There are no findings suspicious for malignancy. Images were processed with CAD.  IMPRESSION: No mammographic evidence of malignancy. A result letter of this screening mammogram will be mailed directly to the patient.  RECOMMENDATION: Screening mammogram in one year. (Code:SM-B-01Y)  BI-RADS CATEGORY  1: Negative.  Electronically Signed   By: Bary Richard M.D.   On: 08/12/2017 09:53 Note: Reviewed        Physical Exam  General appearance: Well nourished, well developed, and well hydrated. In no apparent acute distress Mental status: Alert, oriented x 3 (person, place, & time)       Respiratory: No evidence of acute respiratory distress Eyes: PERLA Vitals: BP 139/60 (Cuff Size: Normal)   Pulse 62   Temp 98 F (36.7 C) (Temporal)   Resp 16   Ht 4\' 10"  (1.473 m)   Wt 184 lb (83.5 kg)   SpO2 98%   BMI 38.46 kg/m  BMI: Estimated body mass index is 38.46 kg/m as calculated from the following:   Height as  of this encounter: 4\' 10"  (1.473 m).   Weight as of this encounter: 184 lb (83.5 kg). Ideal: Patient must be at least 60 in tall to calculate ideal body weight    Lumbar Exam  Skin & Axial Inspection: Well healed scar from previous spine surgery detected Alignment: Asymmetric Functional ROM: Mechanically restricted ROM       Stability: No instability detected Muscle Tone/Strength: Functionally intact. No obvious neuro-muscular anomalies detected. Sensory (Neurological): Dermatomal pain pattern right hip/leg   Gait & Posture Assessment  Ambulation: Unassisted Gait: Antalgic Posture: Difficulty standing up straight, due to pain    Lower Extremity Exam      Side: Right lower extremity   Side: Left lower extremity  Stability: No instability observed           Stability: No instability observed          Skin & Extremity Inspection: Skin color, temperature, and hair growth are WNL.  No peripheral edema or cyanosis. No masses, redness, swelling, asymmetry, or associated skin lesions. No contractures.   Skin & Extremity Inspection: Skin color, temperature, and hair growth are WNL. No peripheral edema or cyanosis. No masses, redness, swelling, asymmetry, or associated skin lesions. No contractures.  Functional ROM: Pain restricted ROM for hip and knee joints           Functional ROM: Unrestricted ROM                  Muscle Tone/Strength: Functionally intact. No obvious neuro-muscular anomalies detected.   Muscle Tone/Strength: Functionally intact. No obvious neuro-muscular anomalies detected.  Sensory (Neurological): Dermatomal pain pattern medial portion of foot (L4)   Sensory (Neurological): Unimpaired        DTR: Patellar: deferred today Achilles: deferred today Plantar: deferred today   DTR: Patellar: deferred today Achilles: deferred today Plantar: deferred today  Palpation: No palpable anomalies   Palpation: No palpable anomalies     Assessment   Diagnosis Status  1. Chronic pain  syndrome   2. Lumbosacral spondylosis without myelopathy   3. Status post lumbar spinal fusion (T10-L4)   4. Postlaminectomy syndrome of lumbosacral region    Controlled Controlled Controlled   Plan of Care   Ms. Synthia Innocent Pinder has a current medication list which includes the following long-term medication(s): atorvastatin, fenofibrate, gabapentin, levothyroxine, lisinopril, metformin, metoprolol tartrate, topiramate, and calcium carbonate-vit d-min.  Pharmacotherapy (Medications Ordered): Meds ordered this encounter  Medications   HYDROcodone-acetaminophen (NORCO/VICODIN) 5-325 MG tablet    Sig: Take 1-2 tablets by mouth daily as needed for moderate pain (pain score 4-6). For chronic pain syndrome    Dispense:  45 tablet    Refill:  0   HYDROcodone-acetaminophen (NORCO/VICODIN) 5-325 MG tablet    Sig: Take 1-2 tablets by mouth daily as needed for moderate pain (pain score 4-6). For chronic pain syndrome    Dispense:  45 tablet    Refill:  0   HYDROcodone-acetaminophen (NORCO/VICODIN) 5-325 MG tablet    Sig: Take 1-2 tablets by mouth daily as needed for moderate pain (pain score 4-6). For chronic pain syndrome    Dispense:  45 tablet    Refill:  0   traMADol (ULTRAM) 50 MG tablet    Sig: Take 1 tablet (50 mg total) by mouth every 12 (twelve) hours as needed for severe pain (pain score 7-10). Must last 30 days    Dispense:  60 tablet    Refill:  2    Chronic Pain: STOP Act (Not applicable) Fill 1 day early if closed on refill date. Avoid benzodiazepines within 8 hours of opioids   Orders:  No orders of the defined types were placed in this encounter.  Follow-up plan:   Return in about 3 months (around 10/29/2023) for (F2F), (MM), Randalyn Rhea NP.    Recent Visits Date Type Provider Dept  05/07/23 Office Visit Edward Jolly, MD [facility]  Showing recent visits within past 90 days and meeting all other requirements Today's Visits Date Type Provider Dept  07/30/23  Office Visit Edward Jolly, MD [facility]  Showing today's visits and meeting all other requirements Future Appointments Date Type Provider Dept  10/22/23 Appointment Bettey Costa, NP [facility]  Showing future appointments within next 90 days and meeting all other requirements  I discussed the assessment and treatment plan with the patient. The patient was provided an opportunity to ask questions and all were answered. The patient agreed  with the plan and demonstrated an understanding of the instructions.  Patient advised to call back or seek an in-person evaluation if the symptoms or condition worsens.  Duration of encounter: .  Note by: Edward Jolly, MD Date: 07/30/2023; Time: 4:04 PM

## 2023-07-30 NOTE — Progress Notes (Signed)
 Nursing Pain Medication Assessment:  Safety precautions to be maintained throughout the outpatient stay will include: orient to surroundings, keep bed in low position, maintain call bell within reach at all times, provide assistance with transfer out of bed and ambulation.  Medication Inspection Compliance: Pill count conducted under aseptic conditions, in front of the patient. Neither the pills nor the bottle was removed from the patient's sight at any time. Once count was completed pills were immediately returned to the patient in their original bottle.  Medication #1: Tramadol (Ultram) Pill/Patch Count:  25 of 60 pills remain Pill/Patch Appearance: Markings consistent with prescribed medication Bottle Appearance: Standard pharmacy container. Clearly labeled. Filled Date: 03 / 05 / 2025 Last Medication intake:  Today  Medication #2: Hydrocodone/APAP Pill/Patch Count:  24 of 45 pills remain Pill/Patch Appearance: Markings consistent with prescribed medication Bottle Appearance: Standard pharmacy container. Clearly labeled. Filled Date:  0 3 / 5 / 2025 Last Medication intake:  Yesterday

## 2023-08-05 DIAGNOSIS — I10 Essential (primary) hypertension: Secondary | ICD-10-CM | POA: Diagnosis not present

## 2023-08-05 DIAGNOSIS — E039 Hypothyroidism, unspecified: Secondary | ICD-10-CM | POA: Diagnosis not present

## 2023-08-05 DIAGNOSIS — E782 Mixed hyperlipidemia: Secondary | ICD-10-CM | POA: Diagnosis not present

## 2023-08-05 DIAGNOSIS — N1831 Chronic kidney disease, stage 3a: Secondary | ICD-10-CM | POA: Diagnosis not present

## 2023-08-05 DIAGNOSIS — E1121 Type 2 diabetes mellitus with diabetic nephropathy: Secondary | ICD-10-CM | POA: Diagnosis not present

## 2023-08-12 DIAGNOSIS — E782 Mixed hyperlipidemia: Secondary | ICD-10-CM | POA: Diagnosis not present

## 2023-08-12 DIAGNOSIS — N1832 Chronic kidney disease, stage 3b: Secondary | ICD-10-CM | POA: Diagnosis not present

## 2023-08-12 DIAGNOSIS — Z6839 Body mass index (BMI) 39.0-39.9, adult: Secondary | ICD-10-CM | POA: Diagnosis not present

## 2023-08-12 DIAGNOSIS — E039 Hypothyroidism, unspecified: Secondary | ICD-10-CM | POA: Diagnosis not present

## 2023-08-12 DIAGNOSIS — E1122 Type 2 diabetes mellitus with diabetic chronic kidney disease: Secondary | ICD-10-CM | POA: Diagnosis not present

## 2023-08-12 DIAGNOSIS — I129 Hypertensive chronic kidney disease with stage 1 through stage 4 chronic kidney disease, or unspecified chronic kidney disease: Secondary | ICD-10-CM | POA: Diagnosis not present

## 2023-08-12 DIAGNOSIS — E66812 Obesity, class 2: Secondary | ICD-10-CM | POA: Diagnosis not present

## 2023-08-17 ENCOUNTER — Other Ambulatory Visit: Payer: Self-pay | Admitting: Student in an Organized Health Care Education/Training Program

## 2023-08-17 DIAGNOSIS — M47817 Spondylosis without myelopathy or radiculopathy, lumbosacral region: Secondary | ICD-10-CM

## 2023-08-17 DIAGNOSIS — G894 Chronic pain syndrome: Secondary | ICD-10-CM

## 2023-08-17 DIAGNOSIS — M961 Postlaminectomy syndrome, not elsewhere classified: Secondary | ICD-10-CM

## 2023-08-17 DIAGNOSIS — Z981 Arthrodesis status: Secondary | ICD-10-CM

## 2023-10-22 ENCOUNTER — Ambulatory Visit: Attending: Nurse Practitioner | Admitting: Nurse Practitioner

## 2023-10-22 ENCOUNTER — Encounter: Payer: Self-pay | Admitting: Nurse Practitioner

## 2023-10-22 VITALS — BP 133/72 | HR 68 | Temp 97.5°F | Resp 16 | Ht 58.5 in | Wt 184.0 lb

## 2023-10-22 DIAGNOSIS — G894 Chronic pain syndrome: Secondary | ICD-10-CM | POA: Diagnosis not present

## 2023-10-22 DIAGNOSIS — Z981 Arthrodesis status: Secondary | ICD-10-CM | POA: Diagnosis not present

## 2023-10-22 DIAGNOSIS — M961 Postlaminectomy syndrome, not elsewhere classified: Secondary | ICD-10-CM | POA: Diagnosis not present

## 2023-10-22 DIAGNOSIS — M47817 Spondylosis without myelopathy or radiculopathy, lumbosacral region: Secondary | ICD-10-CM | POA: Diagnosis not present

## 2023-10-22 MED ORDER — HYDROCODONE-ACETAMINOPHEN 5-325 MG PO TABS: 1.0000 | ORAL_TABLET | Freq: Every day | ORAL | 0 refills | Status: AC | PRN

## 2023-10-22 MED ORDER — TRAMADOL HCL 50 MG PO TABS: 50.0000 mg | ORAL_TABLET | Freq: Two times a day (BID) | ORAL | 2 refills | Status: DC | PRN

## 2023-10-22 MED ORDER — GABAPENTIN 300 MG PO CAPS: 300.0000 mg | ORAL_CAPSULE | Freq: Two times a day (BID) | ORAL | 11 refills | Status: AC

## 2023-10-22 NOTE — Progress Notes (Signed)
 PROVIDER NOTE: Interpretation of information contained herein should be left to medically-trained personnel. Specific patient instructions are provided elsewhere under Patient Instructions section of medical record. This document was created in part using AI and STT-dictation technology, any transcriptional errors that may result from this process are unintentional.  Patient: Cynthia Dawson  Service: E/M   PCP: Alla Amis, MD  DOB: 1945-07-07  DOS: 10/22/2023  Provider: Emmy MARLA Blanch, NP  MRN: [id number]  Delivery: Face-to-face  Specialty: Interventional Pain Management  Type: Established Patient  Setting: Ambulatory outpatient facility  Specialty designation: 09  Referring Prov.: Alla Amis, MD  Location: Outpatient office facility       History of present illness (HPI) Cynthia Dawson, a 78 y.o. year old female, is here today because of her Chronic pain syndrome [G89.4]. 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   Pain Assessment: Severity of Chronic pain is reported as a 7 /10. Location: Back Right, Lower/Occasionally will radiate down right leg. Onset: More than a month ago. Quality: Constant, Throbbing. Timing: Constant. Modifying factor(s): Rest, Medication. Vitals:  height is 4' 10.5 (1.486 m) and weight is 184 lb (83.5 kg). Her temperature is 97.5 F (36.4 C) (abnormal). Her blood pressure is 133/72 and her pulse is 68. Her respiration is 16 and oxygen saturation is 97%.  BMI: Estimated body mass index is 37.8 kg/m as calculated from the following:   Height as of  this encounter: 4' 10.5 (1.486 m).   Weight as of this encounter: 184 lb (83.5 kg).  Last encounter: 07/30/2023 Last procedure: Visit date not found.  Reason for encounter: medication management. No change in medical history since last visit.  Patient's pain is at baseline.  Patient continues multimodal pain regimen as prescribed.  States that it provides pain relief and improvement in functional status.   Pharmacotherapy Assessment   Hydrocodone -acetaminophen  (Norco/Vicodin) 5-325 mg tablet daily as needed for moderate pain. MME=9.78 Tramadol  (Ultram ) 50 mg tablet by mouth every 12 hours as needed for severe pain. MME=20 Gabapentin  (Neurontin ) 300 mg capsule 2 times daily for neuropathic pain  Monitoring: [place] PMP: PDMP reviewed during this encounter.       Pharmacotherapy: No side-effects or adverse reactions reported. Compliance: No problems identified. Effectiveness: Clinically acceptable.  Erlene Doyal SAUNDERS, [place]  10/22/2023 10:17 AM  Sign when Signing Visit Nursing Pain Medication Assessment:  Safety precautions to be maintained throughout the outpatient stay will include: orient to surroundings, keep bed in low position, maintain call bell within reach at all times, provide assistance with transfer out of bed and ambulation.  Medication Inspection Compliance: Pill count conducted under aseptic conditions, in front of the patient. Neither the pills nor the bottle was removed from the patient's sight at any time. Once count was completed pills were immediately returned to the patient in their original bottle.  Medication: Tramadol  (Ultram ) Pill/Patch Count: 71 of 120 pills/patches remain Pill/Patch Appearance: Markings consistent with prescribed medication Bottle Appearance: Standard pharmacy container. Clearly labeled. Filled Date: 06 / 27 / 2025 Last Medication intake:  Today  Erlene Doyal SAUNDERS, CMA  10/22/2023 10:01 AM  Sign when Signing Visit Nursing  Pain Medication Assessment:   Safety precautions to be maintained throughout the outpatient stay will include: orient to surroundings, keep bed in low position, maintain call bell within reach at all times, provide assistance with transfer out of bed and ambulation.  Medication Inspection Compliance: Pill count conducted under aseptic conditions, in front of the patient. Neither the pills nor the bottle was removed from the patient's sight at any time. Once count was completed pills were immediately returned to the patient in their original bottle.  Medication: Hydrocodone /APAP Pill/Patch Count: 30 of 45 pills/patches remain Pill/Patch Appearance: Markings consistent with prescribed medication Bottle Appearance: Standard pharmacy container. Clearly labeled. Filled Date: 06 / 17 / 2025 Last Medication intake:  Yesterday    UDS:  Summary  Date Value Ref Range Status  11/04/2022 Note  Final    Comment:    ==================================================================== [employer] Select 13 (MW) ==================================================================== Test                             Result       Flag       Units  Drug Present and Declared for Prescription Verification   Norhydrocodone                 123          EXPECTED   ng/mg creat    Norhydrocodone is an expected metabolite of hydrocodone .    Tramadol                        >5155        EXPECTED   ng/mg creat   O-Desmethyltramadol            >5155        EXPECTED   ng/mg creat   N-Desmethyltramadol            807          EXPECTED   ng/mg creat    Source of tramadol  is a prescription medication. O-desmethyltramadol    and N-desmethyltramadol are expected metabolites of tramadol .  Drug Absent but Declared for Prescription Verification   Hydrocodone                     Not Detected UNEXPECTED ng/mg creat    Hydrocodone  is almost always present in patients taking this drug    consistently. Absence of hydrocodone  could be due to lapse of time     since the last dose or unusual pharmacokinetics (rapid metabolism).  ==================================================================== Test                      Result    Flag   Units      Ref Range   Creatinine              97               mg/dL      >=79 ==================================================================== Declared Medications:  The flagging and interpretation on this report are based on the  following declared medications.  Unexpected results may arise from  inaccuracies in the declared medications.   **Note: The testing scope of this panel includes these medications:   Hydrocodone  (Norco)  Tramadol  (Ultram )   **Note: The testing scope of this panel does not include the  following reported medications:   Acetaminophen  (Norco)  Aspirin  Atorvastatin (Lipitor)  Calcium  Diclofenac (Voltaren)  Fenofibrate (TriCor)  Gabapentin  (Neurontin )  Levothyroxine (Synthroid)  Lisinopril (Zestril)  Metformin  Metoprolol (Lopressor)  Nystatin (Mycostatin)  Topiramate  (Topamax )  Vitamin D3 ==================================================================== For clinical consultation, please call [phone]. ====================================================================     No results found for: CBDTHCR No results found for: D8THCCBX No results found for: D9THCCBX  ROS  Constitutional: Denies any fever or chills Gastrointestinal: No reported hemesis, hematochezia, vomiting, or acute GI distress Musculoskeletal: Lower back pain (R>L) Neurological: No reported episodes of acute onset apraxia, aphasia, dysarthria, agnosia, amnesia, paralysis, loss of coordination, or loss of consciousness  Medication Review  Calcium Carbonate-Vit D-Min, HYDROcodone -acetaminophen , Vitamin D3, aspirin EC, atorvastatin, diclofenac sodium, fenofibrate, gabapentin , levothyroxine, lisinopril, metFORMIN, metoprolol tartrate, nystatin cream, topiramate , and  traMADol   History Review  Allergy: Cynthia Dawson is allergic to aspartame and phenylalanine, gemfibrozil, penicillins, and adhesive [tape]. Drug: Cynthia Dawson  reports no history of drug use. Alcohol:  reports no history of alcohol use. Tobacco:  reports that she has never smoked. She has never used smokeless tobacco. Social: Cynthia Dawson  reports that she has never smoked. She has never used smokeless tobacco. She reports that she does not drink alcohol and does not use drugs. Medical:  has a past medical history of Arthritis, Diabetes mellitus without complication (HCC), Heart murmur, Hypercholesteremia, Hypertension, Hypothyroidism, and Neuropathy. Surgical: Cynthia Dawson  has a past surgical history that includes Back surgery; Cesarean section; Cholecystectomy; Abdominal hysterectomy; Cataract extraction w/PHACO (Right, 07/03/2015); Eye surgery; Colonoscopy with propofol  (N/A, 06/06/2016); and Cataract extraction w/PHACO (Left, 03/01/2019). Family: family history includes Breast cancer in her paternal aunt.  Laboratory Chemistry Profile   Renal No results found for: BUN, CREATININE, LABCREA, BCR, GFR, GFRAA, GFRNONAA, LABVMA, EPIRU, EPINEPH24HUR, NOREPRU, NOREPI24HUR, DOPARU, [id number]  Hepatic No results found for: AST, ALT, ALBUMIN, ALKPHOS, HCVAB, AMYLASE, LIPASE, AMMONIA  Electrolytes No results found for: NA, K, CL, CALCIUM, MG, PHOS  Bone No results found for: VD25OH, [id number], CI6874NY7, CI7874NY7, 25OHVITD1, 25OHVITD2, 25OHVITD3, TESTOFREE, TESTOSTERONE  Inflammation (CRP: Acute Phase) (ESR: Chronic Phase) No results found for: CRP, ESRSEDRATE, LATICACIDVEN       Note: Above Lab results reviewed.  Recent Imaging Review  MM 3D SCREEN BREAST BILATERAL CLINICAL DATA:  Screening.  EXAM: DIGITAL SCREENING BILATERAL MAMMOGRAM WITH TOMO AND CAD  COMPARISON:  Previous exam(s).  ACR Breast Density Category b:  There are scattered areas of fibroglandular density.  FINDINGS: There are no findings suspicious for malignancy. Images were processed with CAD.  IMPRESSION: No mammographic evidence of malignancy. A result letter of this screening mammogram will be mailed directly to the patient.  RECOMMENDATION: Screening mammogram in one year. (Code:SM-B-01Y)  BI-RADS CATEGORY  1: Negative.  Electronically Signed   By: Lael Hines M.D.   On: 08/12/2017 09:53 Note: Reviewed        Physical Exam  Vitals: BP 133/72   Pulse 68   Temp (!) 97.5 F (36.4 C)   Resp 16   Ht 4' 10.5 (1.486 m)   Wt 184 lb (83.5 kg)   SpO2 97%   BMI 37.80 kg/m  BMI: Estimated body mass index is 37.8 kg/m as calculated from the following:   Height as of this encounter: 4' 10.5 (1.486 m).   Weight as of this encounter: 184 lb (83.5 kg). Ideal: Patient must be at least 60 in tall to calculate ideal body weight General appearance: Well nourished, well developed, and well hydrated. In no apparent acute distress Mental status: Alert, oriented x 3 (person, place, & time)  Respiratory: No evidence of acute respiratory distress Eyes: PERLA   Assessment   Diagnosis Status  1. Chronic pain syndrome   2. Lumbosacral spondylosis without myelopathy   3. Status post lumbar spinal fusion (T10-L4)   4. Postlaminectomy syndrome of lumbosacral region   5. Status post thoracic spinal fusion (T10-L4)    Controlled Controlled Controlled   Updated Problems: No problems updated.  Plan of Care  Problem-specific:  Assessment and Plan  We will continue on current medication regimen.  Prescribing drug monitoring (PDMP) reviewed; findings consistent with the use of prescribed medication and no evidence of narcotic misuse or abuse. Routine UDS ordered today.  No other new issues or problems reported to this visit.  Schedule follow-up in 90 days for medication management.  Cynthia Dawson has a current medication  list which includes the following long-term medication(s): atorvastatin, calcium carbonate-vit d-min, fenofibrate, gabapentin , levothyroxine, lisinopril, metformin, metoprolol tartrate, and topiramate .  Pharmacotherapy (Medications Ordered): Meds ordered this encounter  Medications   HYDROcodone -acetaminophen  (NORCO/VICODIN) 5-325 MG tablet    Sig: Take 1-2 tablets by mouth daily as needed for up to 23 days for moderate pain (pain score 4-6). For chronic pain syndrome    Dispense:  45 tablet    Refill:  0   HYDROcodone -acetaminophen  (NORCO/VICODIN) 5-325 MG tablet    Sig: Take 1-2 tablets by mouth daily as needed for up to 23 days for moderate pain (pain score 4-6). For chronic pain syndrome    Dispense:  45 tablet    Refill:  0   HYDROcodone -acetaminophen  (NORCO/VICODIN) 5-325 MG tablet    Sig: Take 1-2 tablets by mouth daily as needed for up to 23 days for moderate pain (pain score 4-6). For chronic pain syndrome    Dispense:  45 tablet    Refill:  0   traMADol  (ULTRAM ) 50 MG tablet    Sig: Take 1 tablet (50 mg total) by mouth every 12 (twelve) hours as needed for severe pain (pain score 7-10). Must last 30 days    Dispense:  60 tablet    Refill:  2    Chronic Pain: STOP Act (Not applicable) Fill 1 day early if closed on refill date. Avoid benzodiazepines within 8 hours of opioids   gabapentin  (NEURONTIN ) 300 MG capsule    Sig: Take 1 capsule (300 mg total) by mouth 2 (two) times daily.    Dispense:  60 capsule    Refill:  11   Orders:  Orders Placed This Encounter  Procedures   Tox[employer]SU[employer] Select 13 (MW), Urine    Volume: 30 ml(s). Minimum 3 ml of urine is needed. Document temperature of fresh sample. Indications: Long term (current) use of opiate analgesic (S20.108)    Release to patient:   Immediate        Return in about 3 months (around 01/22/2024) for (F2F), (MM), Emmy Blanch NP.    Recent Visits Date Type Provider Dept  07/30/23 Office Visit Marcelino Nurse, MD  [facility]  Showing recent visits within past 90 days and meeting all other requirements Today's Visits Date Type Provider Dept  10/22/23 Office Visit Muneer Leider K, NP [facility]  Showing today's visits and meeting all other requirements Future Appointments No visits were found meeting these conditions. Showing future appointments within next 90 days and meeting all other requirements  I discussed the assessment and treatment plan with the patient. The patient was provided an opportunity to ask questions and all were answered. The patient  agreed with the plan and demonstrated an understanding of the instructions.  Patient advised to call back or seek an in-person evaluation if the symptoms or condition worsens.  Duration of encounter: 30 minutes.  Total time on encounter, as per AMA guidelines included both the face-to-face and non-face-to-face time personally spent by the physician and/or other qualified health care professional(s) on the day of the encounter (includes time in activities that require the physician or other qualified health care professional and does not include time in activities normally performed by clinical staff). Physician's time may include the following activities when performed: Preparing to see the patient (e.g., pre-charting review of records, searching for previously ordered imaging, lab work, and nerve conduction tests) Review of prior analgesic pharmacotherapies. Reviewing PMP Interpreting ordered tests (e.g., lab work, imaging, nerve conduction tests) Performing post-procedure evaluations, including interpretation of diagnostic procedures Obtaining and/or reviewing separately obtained history Performing a medically appropriate examination and/or evaluation Counseling and educating the patient/family/caregiver Ordering medications, tests, or procedures Referring and communicating with other health care professionals (when not separately  reported) Documenting clinical information in the electronic or other health record Independently interpreting results (not separately reported) and communicating results to the patient/ family/caregiver Care coordination (not separately reported)  Note by: Mitsue Peery K Keymiah Lyles, NP (TTS and AI technology used. I apologize for any typographical errors that were not detected and corrected.) Date: 10/22/2023; Time: 10:28 AM

## 2023-10-22 NOTE — Progress Notes (Signed)
 Nursing Pain Medication Assessment:  Safety precautions to be maintained throughout the outpatient stay will include: orient to surroundings, keep bed in low position, maintain call bell within reach at all times, provide assistance with transfer out of bed and ambulation.  Medication Inspection Compliance: Pill count conducted under aseptic conditions, in front of the patient. Neither the pills nor the bottle was removed from the patient's sight at any time. Once count was completed pills were immediately returned to the patient in their original bottle.  Medication: Hydrocodone /APAP Pill/Patch Count: 30 of 45 pills/patches remain Pill/Patch Appearance: Markings consistent with prescribed medication Bottle Appearance: Standard pharmacy container. Clearly labeled. Filled Date: 06 / 17 / 2025 Last Medication intake:  Yesterday

## 2023-10-22 NOTE — Progress Notes (Signed)
 Nursing Pain Medication Assessment:  Safety precautions to be maintained throughout the outpatient stay will include: orient to surroundings, keep bed in low position, maintain call bell within reach at all times, provide assistance with transfer out of bed and ambulation.  Medication Inspection Compliance: Pill count conducted under aseptic conditions, in front of the patient. Neither the pills nor the bottle was removed from the patient's sight at any time. Once count was completed pills were immediately returned to the patient in their original bottle.  Medication: Tramadol  (Ultram ) Pill/Patch Count: 71 of 120 pills/patches remain Pill/Patch Appearance: Markings consistent with prescribed medication Bottle Appearance: Standard pharmacy container. Clearly labeled. Filled Date: 06 / 27 / 2025 Last Medication intake:  Today

## 2023-10-28 LAB — TOXASSURE SELECT 13 (MW), URINE

## 2023-11-04 DIAGNOSIS — E782 Mixed hyperlipidemia: Secondary | ICD-10-CM | POA: Diagnosis not present

## 2023-11-04 DIAGNOSIS — E039 Hypothyroidism, unspecified: Secondary | ICD-10-CM | POA: Diagnosis not present

## 2023-11-04 DIAGNOSIS — E1121 Type 2 diabetes mellitus with diabetic nephropathy: Secondary | ICD-10-CM | POA: Diagnosis not present

## 2023-11-04 DIAGNOSIS — N1832 Chronic kidney disease, stage 3b: Secondary | ICD-10-CM | POA: Diagnosis not present

## 2023-11-04 DIAGNOSIS — D631 Anemia in chronic kidney disease: Secondary | ICD-10-CM | POA: Diagnosis not present

## 2023-11-11 DIAGNOSIS — E782 Mixed hyperlipidemia: Secondary | ICD-10-CM | POA: Diagnosis not present

## 2023-11-11 DIAGNOSIS — E039 Hypothyroidism, unspecified: Secondary | ICD-10-CM | POA: Diagnosis not present

## 2023-11-11 DIAGNOSIS — E1122 Type 2 diabetes mellitus with diabetic chronic kidney disease: Secondary | ICD-10-CM | POA: Diagnosis not present

## 2023-11-11 DIAGNOSIS — N1832 Chronic kidney disease, stage 3b: Secondary | ICD-10-CM | POA: Diagnosis not present

## 2023-11-11 DIAGNOSIS — I129 Hypertensive chronic kidney disease with stage 1 through stage 4 chronic kidney disease, or unspecified chronic kidney disease: Secondary | ICD-10-CM | POA: Diagnosis not present

## 2023-11-11 DIAGNOSIS — E66812 Obesity, class 2: Secondary | ICD-10-CM | POA: Diagnosis not present

## 2023-11-11 DIAGNOSIS — Z6838 Body mass index (BMI) 38.0-38.9, adult: Secondary | ICD-10-CM | POA: Diagnosis not present

## 2023-11-11 NOTE — Progress Notes (Signed)
 Chief Complaint  Patient presents with  . Follow-up    HPI  Cynthia Dawson is a 78 y.o. here for follow up of chronic medical issues  HTN: No acute issues.  Tolerating meds without adverse effects.  Not checking home blood pressures.  Denies HAs, vision changes, dizziness, cp, or syncopal episodes.  AODM: No acute issues.  Tolerating meds without adverse effects.  Average blood sugar less than 150 fasting.  No hypoglycemic episodes.  No new neuropathy.    Obesity: Aware of weight issue.  No specific weight loss regimen.  Mixed HLD: No acute issues.  Tolerating medications without adverse effects.  Denies any myalgia.  No cp or CVA symptoms.   Hypothyroidism: No acute issues.  Tolerating med w/o adverse effects.  Denies any palpitations, wt changes, or temperature fluctuations.   CKD: No acute urinary symptoms.  Tolerating lisinopril without adverse effects.  Avoiding NSAIDs.  Drinking more water.  ROS Review of systems is unremarkable for any active cardiac, respiratory, GI, GU, hematologic, neurologic, dermatologic, HEENT, or psychiatric symptoms except as noted above.  No fevers, chills, or constitutional symptoms.   Current Outpatient Medications  Medication Sig Dispense Refill  . aspirin 81 MG EC tablet Take 81 mg by mouth once daily.      SABRA atorvastatin (LIPITOR) 20 MG tablet Take 1 tablet (20 mg total) by mouth every evening 100 tablet 1  . blood glucose control, normal Soln Use as needed. [employer] True Met[employer] Air, LEVEL 1 Dx E11.21 1 each 1  . blood glucose diagnostic (TRUE METRIX GLUCOSE TEST STRIP) test strip Check blood sugar once daily fasting. Dx E11.21 100 strip 3  . blood glucose meter kit [employer]; Check blood sugar fasting once daily. Dx E11.21 1 each 0  . calcium carbonate 500 mg calcium (1,250 mg) tablet Take 500 mg of elemental by mouth once daily    . cetirizine (ZYRTEC) 10 MG tablet Take 10 mg by mouth once daily    . cholecalciferol (VITAMIN D3)  5,000 unit capsule Take 5,000 Units by mouth once daily    . diclofenac (VOLTAREN) 1 % topical gel APPLY 2 TO 4 GRAMS TO THE AFFECTED AREA(S) 4 TIMES DAILY AS NEEDED 100 g 0  . DOCUSATE CALCIUM (STOOL SOFTENER ORAL) Take 1 tablet by mouth once daily as needed.      . DROPSAFE ALCOHOL PREP PADS PadM USE  TO CHECK BLOOD SUGAR ONE TIME DAILY 100 each 1  . fenofibrate nanocrystallized (TRICOR) 145 MG tablet TAKE 1 TABLET EVERY DAY 90 tablet 3  . gabapentin  (NEURONTIN ) 300 MG capsule Take 600 mg by mouth 3 (three) times daily as needed    . HYDROcodone -acetaminophen  (NORCO) 5-325 mg tablet Take 1 tablet by mouth 2 (two) times daily as needed for Pain    . levothyroxine (SYNTHROID) 75 MCG tablet TAKE 1 TABLET EVERY MORNING AT 6:30AM ON EMPTY STOMACH WITH A GLASS OF WATER AT LEAST 30 TO 60 MIN BEFORE BREAKFAST 90 tablet 3  . lisinopriL (ZESTRIL) 10 MG tablet TAKE 1 TABLET EVERY DAY 90 tablet 3  . metFORMIN (GLUCOPHAGE) 1000 MG tablet TAKE 1/2 TABLET EVERY EVENING WITH EVENING MEAL AS DIRECTED 45 tablet 3  . metoprolol TARTrate (LOPRESSOR) 25 MG tablet TAKE 1/2 TABLET TWICE DAILY 90 tablet 3  . nystatin (MYCOSTATIN) 100,000 unit/gram cream APPLY TOPICALLY TWICE DAILY (Patient taking differently: 2 (two) times daily as needed) 60 g 1  . topiramate  (TOPAMAX ) 50 MG tablet Take 1 tablet by  mouth 2 (two) times daily    . traMADol  (ULTRAM ) 50 mg tablet Take 50 mg by mouth every 8 (eight) hours as needed    . TRUEPLUS LANCETS CHECK BLOOD SUGAR ONE TIME DAILY FASTING 100 each 3   No current facility-administered medications for this visit.    Allergies as of 11/11/2023 - Reviewed 11/11/2023  Allergen Reaction Noted  . Aspartame Other (See Comments) 12/26/2014  . Gemfibrozil Other (See Comments) 12/22/2013  . Penicillin Rash 12/28/2013  . Adhesive Rash 02/22/2019    Patient Active Problem List  Diagnosis  . Essential hypertension  . Mixed hyperlipidemia (LDL 59, Trig 261 - 11/04/23)  . Acquired  hypothyroidism (TSH 1.3 - 11/04/23)  . History of arthritis  . History of scoliosis  . Type 2 diabetes mellitus with kidney complication, without long-term current use of insulin (A1c 6.6% - 11/04/23)  . Class 2 severe obesity due to excess calories with serious comorbidity and body mass index (BMI) of 38.0 to 38.9 in adult (CMS/HHS-HCC)  . Osteopenia  . Chronic back pain - followed by Dr. Marcelino [facility] pain management)  . Hx of adenomatous polyp of colon  . CKD (chronic kidney disease) stage 3, (Cr 1.2, GFR 46 - 11/04/23)  . Medicare annual wellness visit, initial: 08/18/12  . Medicare annual wellness visit, subsequent 05/08/23  . DNR (do not resuscitate)    Past Medical History:  Diagnosis Date  . Acquired hypothyroidism   . Chronic back pain    followed by [facility] clinic  . CKD (chronic kidney disease) stage 3, Cr 1.3, GFR 40 - 12/21/15 12/28/2015  . Diabetes mellitus type 2, uncomplicated (CMS/HHS-HCC)   . History of allergic rhinitis   . History of arthritis    back  . History of scoliosis    back  . Hypertension   . Mixed hyperlipidemia   . Obesity   . Osteopenia    (dexa 09/01/12)    Past Surgical History:  Procedure Laterality Date  . HYSTERECTOMY  1975  . COLONOSCOPY  11/19/2012   Adenomatous Polyp: [facility] 11/2015; Recall Ltr mailed 10/10/2015 (dw)  . Back surgery  08/29/13  . COLONOSCOPY  06/06/2016   Adenomatous Polyp: [facility] 05/2021  . APPENDECTOMY    . CHOLECYSTECTOMY      Vitals:   11/11/23 1141  BP: (!) 150/80  Pulse: 57  SpO2: 97%  Weight: 82.7 kg (182 lb 6.4 oz)  Height: 146 cm (4' 9.48)  PainSc:   5  PainLoc: Back   Body mass index is 38.81 kg/m.  Exam  General. Well appearing; NAD; VS reviewed     Eyes. Sclera and conjunctiva clear; Vision grossly intact; extraocular movements intact Neck. Supple.  Lungs. Respirations unlabored; clear to auscultation bilaterally Cardiovascular. Heart regular rate and rhythm without murmurs, gallops, or rubs Abdomen.  Soft; non tender; non distended; no masses or organomegaly Extremities. no edema Skin. Normal color and turgor Neurologic. Alert and oriented x3; CN 2-12 grossly intact; no focal deficits  Assessment and Plan  1. Type 2 diabetes mellitus with diabetic nephropathy, without long-term current use of insulin (CMS/HHS-HCC) Well controlled.  A1c 6.6% (11/04/2023) no changes to current regimen.  Advised to check CBGs twice a week with target less than 150 (fasting).  Advised on annual DM eye exam.  Counseled on low starch and low sugar diet.  2. Class 2 severe obesity due to excess calories with serious comorbidity and body mass index (BMI) of 38.0 to 38.9 in adult (CMS/HHS-HCC) Not  at goal.  Counseled on nutrition modification (low sugar/carb mediterranean) and exercise (strength/cardio).  Goal is to lose 1lb/wk.  Focus on low calorie diet (< net 1400/day).    3. Essential hypertension Mildly elevated.  Target is less than 140/90.  Will ask her to check blood pressure more frequently with goal of twice a week.  Creatinine 1.2 and electrolytes are within normal limits.  Focus on more exercise.  4. Mixed hyperlipidemia (LDL 59, Trig 261 - 11/04/23) Well controlled except for elevated triglycerides.  LFTs wnls (10/2023). Continue with current regimen.  Counseled on mediterranean diet and aerobic exercise.  5. Stage 3b chronic kidney disease (CMS/HHS-HCC) Improving.  Creatinine 1.2 and GFR 46.  Focus on hydration and avoidance of nephrotoxic agents.  Continue with lisinopril for renal protection.  6. Acquired hypothyroidism (TSH 1.3 - 11/04/23) Well-controlled.  No changes to regimen.  Other orders -     Follow up in Primary Care  F/u 6 months for 6 months for PE and subsequent medicare wellness; labs prior  ALDA CARPEN, MD

## 2024-01-18 ENCOUNTER — Ambulatory Visit
Admission: RE | Admit: 2024-01-18 | Discharge: 2024-01-18 | Disposition: A | Discharge status: Home / Self Care | Source: Ambulatory Visit | Attending: Nurse Practitioner | Admitting: Nurse Practitioner

## 2024-01-18 ENCOUNTER — Ambulatory Visit: Admission: RE | Admit: 2024-01-18 | Source: Home / Self Care

## 2024-01-18 ENCOUNTER — Ambulatory Visit: Admitting: Nurse Practitioner

## 2024-01-18 ENCOUNTER — Encounter: Payer: Self-pay | Admitting: Nurse Practitioner

## 2024-01-18 VITALS — BP 136/50 | HR 78 | Temp 99.1°F | Resp 18 | Ht <= 58 in | Wt 181.7 lb

## 2024-01-18 DIAGNOSIS — Z981 Arthrodesis status: Secondary | ICD-10-CM | POA: Insufficient documentation

## 2024-01-18 DIAGNOSIS — M47817 Spondylosis without myelopathy or radiculopathy, lumbosacral region: Secondary | ICD-10-CM | POA: Insufficient documentation

## 2024-01-18 DIAGNOSIS — M4316 Spondylolisthesis, lumbar region: Secondary | ICD-10-CM | POA: Diagnosis not present

## 2024-01-18 DIAGNOSIS — M47816 Spondylosis without myelopathy or radiculopathy, lumbar region: Secondary | ICD-10-CM | POA: Diagnosis not present

## 2024-01-18 DIAGNOSIS — G894 Chronic pain syndrome: Secondary | ICD-10-CM | POA: Diagnosis not present

## 2024-01-18 DIAGNOSIS — M961 Postlaminectomy syndrome, not elsewhere classified: Secondary | ICD-10-CM | POA: Diagnosis not present

## 2024-01-18 DIAGNOSIS — M5186 Other intervertebral disc disorders, lumbar region: Secondary | ICD-10-CM | POA: Diagnosis not present

## 2024-01-18 DIAGNOSIS — Z79899 Other long term (current) drug therapy: Secondary | ICD-10-CM | POA: Insufficient documentation

## 2024-01-18 DIAGNOSIS — M438X6 Other specified deforming dorsopathies, lumbar region: Secondary | ICD-10-CM | POA: Diagnosis not present

## 2024-01-18 MED ORDER — HYDROCODONE-ACETAMINOPHEN 5-325 MG PO TABS: 1.0000 | ORAL_TABLET | Freq: Three times a day (TID) | ORAL | 0 refills | Status: AC | PRN

## 2024-01-18 MED ORDER — HYDROCODONE-ACETAMINOPHEN 5-325 MG PO TABS: 1.0000 | ORAL_TABLET | Freq: Three times a day (TID) | ORAL | 0 refills | Status: DC | PRN

## 2024-01-18 MED ORDER — TRAMADOL HCL 50 MG PO TABS: 50.0000 mg | ORAL_TABLET | Freq: Two times a day (BID) | ORAL | 2 refills | Status: DC | PRN

## 2024-01-18 NOTE — Progress Notes (Signed)
 PROVIDER NOTE: Interpretation of information contained herein should be left to medically-trained personnel. Specific patient instructions are provided elsewhere under Patient Instructions section of medical record. This document was created in part using AI and STT-dictation technology, any transcriptional errors that may result from this process are unintentional.  Patient: Cynthia Dawson  Service: E/M   PCP: Alla Amis, MD  DOB: 08/26/1945  DOS: 01/18/2024  Provider: Emmy MARLA Blanch, NP  MRN: [id number]  Delivery: Face-to-face  Specialty: Interventional Pain Management  Type: Established Patient  Setting: Ambulatory outpatient facility  Specialty designation: 09  Referring Prov.: Alla Amis, MD  Location: Outpatient office facility       History of present illness (HPI) Cynthia Dawson, a 78 y.o. year old female, is here today because of her Chronic pain syndrome [G89.4]. Cynthia Dawson's primary complain today is Back Pain (Lower back radiating all the way to to left foot and toes)  Pertinent problems: Ms. 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  Pain Assessment: Severity of Chronic pain is reported as a 6 /10. Location: Back Right/Down right leg and right foot and toes. Onset: More than a month ago. Quality: Aching, Constant, Numbness. Timing: Constant. Modifying factor(s): Resting, changing positions. Vitals:  height is 4' 10 (1.473 m) and weight is 181 lb 11.2 oz (82.4 kg). Her oral temperature is 99.1 F (37.3 C). Her blood pressure is 136/50 (abnormal) and her pulse is 78. Her respiration is 18 and oxygen saturation is 96%.  BMI: Estimated body  mass index is 37.98 kg/m as calculated from the following:   Height as of this encounter: 4' 10 (1.473 m).   Weight as of this encounter: 181 lb 11.2 oz (82.4 kg).  Last encounter: 10/22/2023. Last procedure: Visit date not found.  Reason for encounter: evaluation of worsening, or previously known (established) problem and medication management.  No change in medical history since last visit.  Patient's pain is at baseline.  Patient continues multimodal pain regimen as prescribed.  States that it provides pain relief and improvement in functional status.  The patient continues experiencing low back pain radiating to the hip, posterior aspect of the leg, and extending to the lateral side of the right ankle.  She has not previously undergone MRI or x-ray imaging for her lower back pain.  We discussed the need for imaging to further evaluate her symptoms.  Due to her anxiety with MRI, an x-ray was ordered for initial evaluation. Pharmacotherapy Assessment   Hydrocodone -acetaminophen  (Norco/Vicodin) 5-325 mg tablet daily as needed for moderate pain. MME=9.78 Tramadol  (Ultram ) 50 mg tablet by mouth every 12 hours as needed for severe pain. MME=20 Monitoring: [place] PMP: PDMP reviewed during this encounter.       Pharmacotherapy: No side-effects or adverse reactions reported. Compliance: No problems identified. Effectiveness: Clinically acceptable.  Cynthia Dawson, [place]  01/18/2024  2:32 PM  Sign when Signing Visit Nursing Pain Medication Assessment:  Safety precautions to be maintained throughout the outpatient stay will include: orient to surroundings, keep bed in low position, maintain call bell within reach at all times, provide assistance with transfer out of bed and ambulation.  Medication Inspection Compliance: Pill count conducted under aseptic conditions, in front of the  patient. Neither the pills nor the bottle was removed from the patient's sight at any time. Once count was completed pills were  immediately returned to the patient in their original bottle.  Medication: Tramadol  (Ultram ) Pill/Patch Count: 29 of 60 pills/patches remain Pill/Patch Appearance: Markings consistent with prescribed medication Bottle Appearance: Standard pharmacy container. Clearly labeled. Filled Date: 09 / 04 / 2025 Last Medication intake:  Today  Cynthia Dawson, CMA  01/18/2024  2:30 PM  Sign when Signing Visit Nursing Pain Medication Assessment:  Safety precautions to be maintained throughout the outpatient stay will include: orient to surroundings, keep bed in low position, maintain call bell within reach at all times, provide assistance with transfer out of bed and ambulation.  Medication Inspection Compliance: Pill count conducted under aseptic conditions, in front of the patient. Neither the pills nor the bottle was removed from the patient's sight at any time. Once count was completed pills were immediately returned to the patient in their original bottle.  Medication: Hydrocodone /APAP Pill/Patch Count: 23 of 45 pills/patches remain Pill/Patch Appearance: Markings consistent with prescribed medication Bottle Appearance: Standard pharmacy container. Clearly labeled. Filled Date: 09 / 04 / 2025 Last Medication intake:  Yesterday    UDS:  Summary  Date Value Ref Range Status  10/22/2023 FINAL  Final    Comment:    ==================================================================== [employer] (MW) ==================================================================== Test                             Result       Flag       Units  Drug Present and Declared for Prescription Verification   Hydrocodone                     114          EXPECTED   ng/mg creat   Norhydrocodone                 245          EXPECTED   ng/mg creat    Sources of hydrocodone  include scheduled prescription medications.    Norhydrocodone is an expected metabolite of hydrocodone .    Tramadol                         >5882        EXPECTED   ng/mg creat   O-Desmethyltramadol            >5882        EXPECTED   ng/mg creat   N-Desmethyltramadol            888          EXPECTED   ng/mg creat    Source of tramadol  is a prescription medication. O-desmethyltramadol    and N-desmethyltramadol are expected metabolites of tramadol .  ==================================================================== Test                      Result    Flag   Units      Ref Range   Creatinine              85               mg/dL      >=79 ==================================================================== Declared Medications:  The flagging and interpretation on this report are based on the  following declared medications.  Unexpected results may arise from  inaccuracies  in the declared medications.   **Note: The testing scope of this panel includes these medications:   Hydrocodone  (Norco)  Tramadol  (Ultram )   **Note: The testing scope of this panel does not include the  following reported medications:   Acetaminophen  (Norco)  Aspirin  Atorvastatin (Lipitor)  Calcium  Fenofibrate (TriCor)  Gabapentin  (Neurontin )  Levothyroxine (Synthroid)  Lisinopril (Zestril)  Metformin (Glucophage)  Metoprolol (Lopressor)  Topical  Topical Diclofenac (Voltaren)  Topiramate  (Topamax )  Vitamin D  Vitamin D3 ==================================================================== For clinical consultation, please call [phone]. ====================================================================     No results found for: CBDTHCR No results found for: D8THCCBX No results found for: D9THCCBX  ROS  Constitutional: Denies any fever or chills Gastrointestinal: No reported hemesis, hematochezia, vomiting, or acute GI distress Musculoskeletal: Low back pain (R>L), radiate down to the right hip to the lateral aspect of the right ankle Neurological: No reported episodes of acute onset apraxia, aphasia, dysarthria, agnosia,  amnesia, paralysis, loss of coordination, or loss of consciousness  Medication Review  Calcium Carbonate-Vit D-Min, HYDROcodone -acetaminophen , Vitamin D3, aspirin EC, atorvastatin, diclofenac sodium, fenofibrate, gabapentin , levothyroxine, lisinopril, metFORMIN, metoprolol tartrate, nystatin cream, topiramate , and traMADol   History Review  Allergy: Ms. Marse is allergic to aspartame and phenylalanine, gemfibrozil, penicillins, and adhesive [tape]. Drug: Ms. Germani  reports no history of drug use. Alcohol:  reports no history of alcohol use. Tobacco:  reports that she has never smoked. She has never used smokeless tobacco. Social: Ms. Piscopo  reports that she has never smoked. She has never used smokeless tobacco. She reports that she does not drink alcohol and does not use drugs. Medical:  has a past medical history of Arthritis, Diabetes mellitus without complication (HCC), Heart murmur, Hypercholesteremia, Hypertension, Hypothyroidism, and Neuropathy. Surgical: Ms. Heyward  has a past surgical history that includes Back surgery; Cesarean section; Cholecystectomy; Abdominal hysterectomy; Cataract extraction w/PHACO (Right, 07/03/2015); Eye surgery; Colonoscopy with propofol  (N/A, 06/06/2016); and Cataract extraction w/PHACO (Left, 03/01/2019). Family: family history includes Breast cancer in her paternal aunt.  Laboratory Chemistry Profile   Renal No results found for: BUN, CREATININE, LABCREA, BCR, GFR, GFRAA, GFRNONAA, LABVMA, EPIRU, EPINEPH24HUR, NOREPRU, NOREPI24HUR, DOPARU, [id number]  Hepatic No results found for: AST, ALT, ALBUMIN, ALKPHOS, HCVAB, AMYLASE, LIPASE, AMMONIA  Electrolytes No results found for: NA, K, CL, CALCIUM, MG, PHOS  Bone No results found for: VD25OH, [id number], CI6874NY7, CI7874NY7, 25OHVITD1, 25OHVITD2, 25OHVITD3, TESTOFREE, TESTOSTERONE  Inflammation (CRP: Acute Phase) (ESR: Chronic Phase) No  results found for: CRP, ESRSEDRATE, LATICACIDVEN       Note: Above Lab results reviewed.  Recent Imaging Review    Physical Exam  Vitals: BP (!) 136/50 (BP Location: Right Arm, Patient Position: Sitting)   Pulse 78   Temp 99.1 F (37.3 C) (Oral)   Resp 18   Ht 4' 10 (1.473 m)   Wt 181 lb 11.2 oz (82.4 kg)   SpO2 96%   BMI 37.98 kg/m  BMI: Estimated body mass index is 37.98 kg/m as calculated from the following:   Height as of this encounter: 4' 10 (1.473 m).   Weight as of this encounter: 181 lb 11.2 oz (82.4 kg). Ideal: Patient must be at least 60 in tall to calculate ideal body weight General appearance: Well nourished, well developed, and well hydrated. In no apparent acute distress Mental status: Alert, oriented x 3 (person, place, & time)       Respiratory: No evidence of acute respiratory distress Eyes: PERLA  Musculoskeletal:  low back pain radiating to the hip,  posterior aspect of the leg, and extending to the lateral side of the right ankle. Lumbar Exam  Skin & Axial Inspection: No masses, redness, or swelling Alignment: Symmetrical Functional ROM: Pain restricted ROM       Stability: No instability detected Muscle Tone/Strength: Functionally intact. No obvious neuro-muscular anomalies detected. Sensory (Neurological): Musculoskeletal pain pattern Palpation: No palpable anomalies       Provocative Tests: Hyperextension/rotation test: (-) bilaterally for facet joint pain. Lumbar quadrant test (Kemp's test): deferred today       Lateral bending test: (+) due to pain. Patrick's Maneuver: (+) for right-sided S-I arthralgia       for right hip arthralgia FABER* test: (+) for right-sided S-I arthralgia       for right hip arthralgia  Assessment   Diagnosis Status  1. Chronic pain syndrome   2. Lumbosacral spondylosis without myelopathy   3. Postlaminectomy syndrome of lumbosacral region   4. Medication management   5. Status post lumbar spinal fusion  (T10-L4)    Controlled Controlled Controlled   Updated Problems: Problem  Medication Management    Plan of Care  Problem-specific:  Assessment and Plan Chronic pain syndrome: Patient's pain level controlled with Norco and tramadol , will continue on current medication regimen.  Prescribing drug monitoring (PMP) reviewed; findings consistent with the use of prescribed medication and no evidence of narcotic misuse or abuse.   Plan: X-ray of Lumbar spine for further evaluation for low back pain   Ms. Cynthia GAILS Poullard has a current medication list which includes the following long-term medication(s): atorvastatin, calcium carbonate-vit d-min, fenofibrate, gabapentin , levothyroxine, lisinopril, metformin, metoprolol tartrate, and topiramate .  Pharmacotherapy (Medications Ordered): Meds ordered this encounter  Medications   HYDROcodone -acetaminophen  (NORCO/VICODIN) 5-325 MG tablet    Sig: Take 1-2 tablets by mouth 3 (three) times daily as needed for up to 23 days for severe pain (pain score 7-10). Must last 30 days    Dispense:  45 tablet    Refill:  0    Chronic Pain: STOP Act (Not applicable) Fill 1 day early if closed on refill date. Avoid benzodiazepines within 8 hours of opioids   HYDROcodone -acetaminophen  (NORCO/VICODIN) 5-325 MG tablet    Sig: Take 1-2 tablets by mouth 3 (three) times daily as needed for up to 23 days for severe pain (pain score 7-10). Must last 30 days    Dispense:  45 tablet    Refill:  0    Chronic Pain: STOP Act (Not applicable) Fill 1 day early if closed on refill date. Avoid benzodiazepines within 8 hours of opioids   HYDROcodone -acetaminophen  (NORCO/VICODIN) 5-325 MG tablet    Sig: Take 1-2 tablets by mouth 3 (three) times daily as needed for up to 23 days for severe pain (pain score 7-10). Must last 30 days    Dispense:  45 tablet    Refill:  0    Chronic Pain: STOP Act (Not applicable) Fill 1 day early if closed on refill date. Avoid benzodiazepines within  8 hours of opioids   traMADol  (ULTRAM ) 50 MG tablet    Sig: Take 1 tablet (50 mg total) by mouth every 12 (twelve) hours as needed for severe pain (pain score 7-10). Must last 30 days    Dispense:  60 tablet    Refill:  2    Chronic Pain: STOP Act (Not applicable) Fill 1 day early if closed on refill date. Avoid benzodiazepines within 8 hours of opioids   Orders:  Orders Placed This Encounter  Procedures   DG Lumbar Spine Complete W/Bend    Patient presents with axial pain with possible radicular component. Please assist us  in identifying specific level(s) and laterality of any additional findings such as: 1. Facet (Zygapophyseal) joint DJD (Hypertrophy, space narrowing, subchondral sclerosis, and/or osteophyte formation) 2. DDD and/or IVDD (Loss of disc height, desiccation, gas patterns, osteophytes, endplate sclerosis, or Black disc disease) 3. Pars defects 4. Spondylolisthesis, spondylosis, and/or spondyloarthropathies (include Degree/Grade of displacement in mm) (stability) 5. Vertebral body Fractures (acute/chronic) (state percentage of collapse) 6. Demineralization (osteopenia/osteoporotic) 7. Bone pathology 8. Foraminal narrowing  9. Surgical changes    Standing Status:   Future    Expiration Date:   04/18/2024    Scheduling Instructions:     Please make sure that the patient understands that this needs to be done as soon as possible. Never have the patient do the imaging just before the next appointment. Inform patient that having the imaging done within the [facility] will expedite the availability of the results and will provide      imaging availability to the requesting physician. In addition inform the patient that the imaging order has an expiration date and will not be renewed if not done within the active period.    Reason for Exam (SYMPTOM  OR DIAGNOSIS REQUIRED):   Low back pain    Preferred imaging location?:   [facility]    Call Results- Best Contact  Number?:   [phone] [facility]    Radiology Contrast Protocol - do NOT remove file path:   [email or web address]    Release to patient:   Immediate        Return in about 3 months (around 04/18/2024) for (F2F), (MM), Emmy Blanch NP.    Recent Visits Date Type Provider Dept  10/22/23 Office Visit Archit Leger K, NP [facility]  Showing recent visits within past 90 days and meeting all other requirements Today's Visits Date Type Provider Dept  01/18/24 Office Visit Aria Pickrell K, NP [facility]  Showing today's visits and meeting all other requirements Future Appointments Date Type Provider Dept  04/06/24 Appointment Joniyah Mallinger K, NP [facility]  Showing future appointments within next 90 days and meeting all other requirements  I discussed the assessment and treatment plan with the patient. The patient was provided an opportunity to ask questions and all were answered. The patient agreed with the plan and demonstrated an understanding of the instructions.  Patient advised to call back or seek an in-person evaluation if the symptoms or condition worsens.  I personally spent a total of 30 minutes in the care of the patient today including preparing to see the patient, getting/reviewing separately obtained history, performing a medically appropriate exam/evaluation, counseling and educating, placing orders, referring and communicating with other health care professionals, documenting clinical information in the EHR, independently interpreting results, communicating results, and coordinating care.   Note by: Emmy MARLA Blanch, NP  Date: 01/18/2024; Time: 3:03 PM

## 2024-01-18 NOTE — Progress Notes (Signed)
 Nursing Pain Medication Assessment:  Safety precautions to be maintained throughout the outpatient stay will include: orient to surroundings, keep bed in low position, maintain call bell within reach at all times, provide assistance with transfer out of bed and ambulation.  Medication Inspection Compliance: Pill count conducted under aseptic conditions, in front of the patient. Neither the pills nor the bottle was removed from the patient's sight at any time. Once count was completed pills were immediately returned to the patient in their original bottle.  Medication: Tramadol  (Ultram ) Pill/Patch Count: 29 of 60 pills/patches remain Pill/Patch Appearance: Markings consistent with prescribed medication Bottle Appearance: Standard pharmacy container. Clearly labeled. Filled Date: 09 / 04 / 2025 Last Medication intake:  Today

## 2024-01-18 NOTE — Progress Notes (Signed)
 Nursing Pain Medication Assessment:  Safety precautions to be maintained throughout the outpatient stay will include: orient to surroundings, keep bed in low position, maintain call bell within reach at all times, provide assistance with transfer out of bed and ambulation.  Medication Inspection Compliance: Pill count conducted under aseptic conditions, in front of the patient. Neither the pills nor the bottle was removed from the patient's sight at any time. Once count was completed pills were immediately returned to the patient in their original bottle.  Medication: Hydrocodone /APAP Pill/Patch Count: 23 of 45 pills/patches remain Pill/Patch Appearance: Markings consistent with prescribed medication Bottle Appearance: Standard pharmacy container. Clearly labeled. Filled Date: 09 / 04 / 2025 Last Medication intake:  Yesterday

## 2024-03-23 ENCOUNTER — Encounter: Payer: Self-pay | Admitting: Nurse Practitioner

## 2024-03-23 ENCOUNTER — Ambulatory Visit: Attending: Nurse Practitioner | Admitting: Nurse Practitioner

## 2024-03-23 VITALS — BP 131/82 | HR 62 | Temp 97.3°F | Resp 18 | Ht 58.5 in | Wt 181.2 lb

## 2024-03-23 DIAGNOSIS — G894 Chronic pain syndrome: Secondary | ICD-10-CM | POA: Diagnosis not present

## 2024-03-23 DIAGNOSIS — Z0289 Encounter for other administrative examinations: Secondary | ICD-10-CM | POA: Insufficient documentation

## 2024-03-23 DIAGNOSIS — Z981 Arthrodesis status: Secondary | ICD-10-CM | POA: Diagnosis not present

## 2024-03-23 DIAGNOSIS — M961 Postlaminectomy syndrome, not elsewhere classified: Secondary | ICD-10-CM | POA: Insufficient documentation

## 2024-03-23 DIAGNOSIS — Z79899 Other long term (current) drug therapy: Secondary | ICD-10-CM | POA: Insufficient documentation

## 2024-03-23 DIAGNOSIS — M47817 Spondylosis without myelopathy or radiculopathy, lumbosacral region: Secondary | ICD-10-CM | POA: Insufficient documentation

## 2024-03-23 MED ORDER — HYDROCODONE-ACETAMINOPHEN 5-325 MG PO TABS: 1.0000 | ORAL_TABLET | Freq: Three times a day (TID) | ORAL | 0 refills | Status: AC | PRN

## 2024-03-23 MED ORDER — TRAMADOL HCL 50 MG PO TABS: 50.0000 mg | ORAL_TABLET | Freq: Two times a day (BID) | ORAL | 2 refills | Status: AC | PRN

## 2024-03-23 NOTE — Progress Notes (Signed)
 Nursing Pain Medication Assessment:  Safety precautions to be maintained throughout the outpatient stay will include: orient to surroundings, keep bed in low position, maintain call bell within reach at all times, provide assistance with transfer out of bed and ambulation.  Medication Inspection Compliance: Pill count conducted under aseptic conditions, in front of the patient. Neither the pills nor the bottle was removed from the patient's sight at any time. Once count was completed pills were immediately returned to the patient in their original bottle.  Medication: Tramadol  (Ultram ) Pill/Patch Count: 39 of 60 pills/patches remain Pill/Patch Appearance: Markings consistent with prescribed medication Bottle Appearance: Standard pharmacy container. Clearly labeled. Filled Date: 70 / 73 / 2025 Last Medication intake:  Today

## 2024-03-23 NOTE — Progress Notes (Signed)
 Nursing Pain Medication Assessment:  Safety precautions to be maintained throughout the outpatient stay will include: orient to surroundings, keep bed in low position, maintain call bell within reach at all times, provide assistance with transfer out of bed and ambulation.  Medication Inspection Compliance: Pill count conducted under aseptic conditions, in front of the patient. Neither the pills nor the bottle was removed from the patient's sight at any time. Once count was completed pills were immediately returned to the patient in their original bottle.  Medication: Hydrocodone /APAP Pill/Patch Count: 5 of 45 pills/patches remain Pill/Patch Appearance: Markings consistent with prescribed medication Bottle Appearance: Standard pharmacy container. Clearly labeled. Filled Date: 34 / 03 / 2025 Last Medication intake:  Yesterday

## 2024-03-23 NOTE — Progress Notes (Signed)
 PROVIDER NOTE: Interpretation of information contained herein should be left to medically-trained personnel. Specific patient instructions are provided elsewhere under Patient Instructions section of medical record. This document was created in part using AI and STT-dictation technology, any transcriptional errors that may result from this process are unintentional.  Patient: Cynthia Dawson  Service: E/M   PCP: Alla Amis, MD  DOB: 09/18/1945  DOS: 03/23/2024  Provider: Emmy MARLA Blanch, NP  MRN: [id number]  Delivery: Face-to-face  Specialty: Interventional Pain Management  Type: Established Patient  Setting: Ambulatory outpatient facility  Specialty designation: 09  Referring Prov.: Alla Amis, MD  Location: Outpatient office facility       History of present illness (HPI) Cynthia Dawson, a 78 y.o. year old female, is here today because of her Lumbosacral spondylosis without myelopathy [M47.817]. Cynthia Dawson's primary complain today is Back Pain  Pertinent problems: Cynthia Dawson has Chronic back pain; Chronic pain syndrome; CKD (chronic kidney disease) stage 3, GFR 30-59 ml/min (HCC); Lumbosacral spondylosis without myelopathy; Type 2 diabetes mellitus with kidney complication, without long-term current use of insulin (HCC); Postlaminectomy syndrome of lumbosacral region; Status post lumbar spinal fusion (T10-L4); Status post thoracic spinal fusion (T10-L4); Pain management contract signed; and Medication management on their pertinent problem list.  Pain Assessment: Severity of Chronic pain is reported as a 5 /10. Location: Back Right/Radiates down right leg. Onset: More than a month ago. Quality: Aching. Timing: Intermittent. Modifying factor(s): Rest. Vitals:  height is 4' 10.5 (1.486 m) and weight is 181 lb 3.2 oz (82.2 kg). Her temporal temperature is 97.3 F (36.3 C) (abnormal). Her blood pressure is 131/82 and her pulse is 62. Her respiration is 18 and oxygen saturation is 98%.   BMI: Estimated body mass index is 37.23 kg/m as calculated from the following:   Height as of this encounter: 4' 10.5 (1.486 m).   Weight as of this encounter: 181 lb 3.2 oz (82.2 kg).  Last encounter: 01/18/2024. Last procedure: Visit date not found.  Reason for encounter: medication management. No change in medical history since last visit.  Patient's pain is at baseline.  Patient continues multimodal pain regimen as prescribed.  States that it provides pain relief and improvement in functional status.   Discussed the use of AI scribe software for clinical note transcription with the patient, who gave verbal consent to proceed.  History of Present Illness   CORNELLA EMMER is a 78 year old female with lumbar spondylosis and multilevel disc disease who presents with low back pain.  She experiences low back pain and has a history of lumbar spondylosis, multilevel disc disease, and disc space narrowing at the L5-S1 level. A history of posterior stabilization fusion hardware from T10 to L4 is noted, which remains intact.  Her current pain management regimen includes hydrocodone  and tramadol . Hydrocodone  is taken once or twice daily depending on activity level, while tramadol  is taken twice daily. She reports no side effects from these medications and staggers her intake to monitor for any adverse reactions.  Her pharmacy is [facility], which provides mail delivery services.     Pharmacotherapy Assessment   Hydrocodone -acetaminophen  (Norco/Vicodin) 5-325 mg tablet daily as needed for moderate pain. MME=7.50 Tramadol  (Ultram ) 50 mg tablet by mouth every 12 hours as needed for severe pain. MME=20 Monitoring:  PMP: PDMP reviewed during this encounter.       Pharmacotherapy: No side-effects or adverse reactions reported. Compliance: No problems identified. Effectiveness: Clinically acceptable.  Erlene Doyal SAUNDERS, CMA  03/23/2024  2:19 PM  Sign when Signing Visit Nursing Pain  Medication Assessment:  Safety precautions to be maintained throughout the outpatient stay will include: orient to surroundings, keep bed in low position, maintain call bell within reach at all times, provide assistance with transfer out of bed and ambulation.  Medication Inspection Compliance: Pill count conducted under aseptic conditions, in front of the patient. Neither the pills nor the bottle was removed from the patient's sight at any time. Once count was completed pills were immediately returned to the patient in their original bottle.  Medication: Hydrocodone /APAP Pill/Patch Count: 5 of 45 pills/patches remain Pill/Patch Appearance: Markings consistent with prescribed medication Bottle Appearance: Standard pharmacy container. Clearly labeled. Filled Date: 19 / 03 / 2025 Last Medication intake:  Nilsa Erlene Doyal JONELLE, [place]  03/23/2024  2:18 PM  Sign when Signing Visit Nursing Pain Medication Assessment:  Safety precautions to be maintained throughout the outpatient stay will include: orient to surroundings, keep bed in low position, maintain call bell within reach at all times, provide assistance with transfer out of bed and ambulation.  Medication Inspection Compliance: Pill count conducted under aseptic conditions, in front of the patient. Neither the pills nor the bottle was removed from the patient's sight at any time. Once count was completed pills were immediately returned to the patient in their original bottle.  Medication: Tramadol  (Ultram ) Pill/Patch Count: 39 of 60 pills/patches remain Pill/Patch Appearance: Markings consistent with prescribed medication Bottle Appearance: Standard pharmacy container. Clearly labeled. Filled Date: 24 / 59 / 2025 Last Medication intake:  Today  UDS:  Summary  Date Value Ref Range Status  10/22/2023 FINAL  Final    Comment:    ==================================================================== [employer] [facility] [employer]  (MW) ==================================================================== Test                             Result       Flag       Units  Drug Present and Declared for Prescription Verification   Hydrocodone                     114          EXPECTED   ng/mg creat   Norhydrocodone                 245          EXPECTED   ng/mg creat    Sources of hydrocodone  include scheduled prescription medications.    Norhydrocodone is an expected metabolite of hydrocodone .    Tramadol                        >5882        EXPECTED   ng/mg creat   O-Desmethyltramadol            >5882        EXPECTED   ng/mg creat   N-Desmethyltramadol            888          EXPECTED   ng/mg creat    Source of tramadol  is a prescription medication. O-desmethyltramadol    and N-desmethyltramadol are expected metabolites of tramadol .  ==================================================================== Test                      Result    Flag   Units      Ref Range   Creatinine  85               mg/dL      >=79 ==================================================================== Declared Medications:  The flagging and interpretation on this report are based on the  following declared medications.  Unexpected results may arise from  inaccuracies in the declared medications.   **Note: The testing scope of this panel includes these medications:   Hydrocodone  (Norco)  Tramadol  (Ultram )   **Note: The testing scope of this panel does not include the  following reported medications:   Acetaminophen  (Norco)  Aspirin  Atorvastatin (Lipitor)  Calcium  Fenofibrate (TriCor)  Gabapentin  (Neurontin )  Levothyroxine (Synthroid)  Lisinopril (Zestril)  Metformin (Glucophage)  Metoprolol (Lopressor)  Topical  Topical Diclofenac (Voltaren)  Topiramate  (Topamax )  Vitamin D  Vitamin D3 ==================================================================== For clinical consultation, please call [phone]. ====================================================================     No results found for: CBDTHCR No results found for: D8THCCBX No results found for: D9THCCBX  ROS  Constitutional: Denies any fever or chills Gastrointestinal: No reported hemesis, hematochezia, vomiting, or acute GI distress Musculoskeletal: Low back pain Neurological: No reported episodes of acute onset apraxia, aphasia, dysarthria, agnosia, amnesia, paralysis, loss of coordination, or loss of consciousness  Medication Review  Calcium Carbonate-Vit D-Min, HYDROcodone -acetaminophen , Vitamin D3, aspirin EC, atorvastatin, diclofenac sodium, fenofibrate, gabapentin , levothyroxine, lisinopril, metFORMIN, metoprolol tartrate, nystatin cream, topiramate , and traMADol   History Review  Allergy: Cynthia Dawson is allergic to aspartame and phenylalanine, gemfibrozil, penicillins, and adhesive [tape]. Drug: Cynthia Dawson  reports no history of drug use. Alcohol:  reports no history of alcohol use. Tobacco:  reports that she has never smoked. She has never used smokeless tobacco. Social: Cynthia Dawson  reports that she has never smoked. She has never used smokeless tobacco. She reports that she does not drink alcohol and does not use drugs. Medical:  has a past medical history of Arthritis, Diabetes mellitus without complication (HCC), Heart murmur, Hypercholesteremia, Hypertension, Hypothyroidism, and Neuropathy. Surgical: Cynthia Dawson  has a past surgical history that includes Back surgery; Cesarean section; Cholecystectomy; Abdominal hysterectomy; Cataract extraction w/PHACO (Right, 07/03/2015); Eye surgery; Colonoscopy with propofol  (N/A, 06/06/2016); and Cataract extraction w/PHACO (Left, 03/01/2019). Family: family history includes Breast cancer in her paternal aunt.  Laboratory Chemistry Profile   Renal No results found for: BUN, CREATININE, LABCREA, BCR, GFR, GFRAA, GFRNONAA, LABVMA, EPIRU,  EPINEPH24HUR, NOREPRU, NOREPI24HUR, DOPARU, [id number]  Hepatic No results found for: AST, ALT, ALBUMIN, ALKPHOS, HCVAB, AMYLASE, LIPASE, AMMONIA  Electrolytes No results found for: NA, K, CL, CALCIUM, MG, PHOS  Bone No results found for: VD25OH, [id number], CI6874NY7, CI7874NY7, 25OHVITD1, 25OHVITD2, 25OHVITD3, TESTOFREE, TESTOSTERONE  Inflammation (CRP: Acute Phase) (ESR: Chronic Phase) No results found for: CRP, ESRSEDRATE, LATICACIDVEN       Note: Above Lab results reviewed.  Recent Imaging Review  DG Lumbar Spine Complete W/Bend CLINICAL DATA:  Low back pain chronically.  EXAM: LUMBAR SPINE - COMPLETE WITH BENDING VIEWS  COMPARISON:  CT 01/08/2006  FINDINGS: Curvature of the lumbar spine convex right which is stable. Posterior stabilization/fusion hardware bridges this curvature arm proximally T10-L4 as hardware is intact. Hardware skips the L2 vertebral body which is somewhat malformed and ill-defined with moderate loss of height. Suggestion of mild anterior spondylolisthesis of L1 on L2. Mild diffuse decreased bone mineralization is present birth moderate to severe spondylosis throughout the visualized lower thoracic and throughout the lumbar spine. Facet arthropathy is present. Disc space narrowing at all levels of the lumbar spine with exception of the L5-S1 level.  IMPRESSION: 1. Moderate to  severe spondylosis of the lumbar spine with multilevel disc disease. 2. Posterior stabilization/fusion hardware intact bridging chronic L2 ill-defined compression deformity. 3. Mild anterior spondylolisthesis of L1 on L2.  Electronically Signed   By: Toribio Agreste M.D.   On: 01/22/2024 09:02 Note: Reviewed        Physical Exam  Vitals: BP 131/82 (BP Location: Right Arm, Patient Position: Sitting, Cuff Size: Normal)   Pulse 62   Temp (!) 97.3 F (36.3 C) (Temporal)   Resp 18   Ht 4' 10.5 (1.486 m)   Wt 181  lb 3.2 oz (82.2 kg)   SpO2 98%   BMI 37.23 kg/m  BMI: Estimated body mass index is 37.23 kg/m as calculated from the following:   Height as of this encounter: 4' 10.5 (1.486 m).   Weight as of this encounter: 181 lb 3.2 oz (82.2 kg). Ideal: Patient must be at least 60 in tall to calculate ideal body weight General appearance: Well nourished, well developed, and well hydrated. In no apparent acute distress Mental status: Alert, oriented x 3 (person, place, & time)       Respiratory: No evidence of acute respiratory distress Eyes: PERLA  Musculoskeletal: +LBP Assessment   Diagnosis Status  1. Lumbosacral spondylosis without myelopathy   2. Chronic pain syndrome   3. Medication management   4. Postlaminectomy syndrome of lumbosacral region   5. Status post lumbar spinal fusion (T10-L4)   6. Status post thoracic spinal fusion (T10-L4)   7. Pain management contract signed    Controlled Controlled Controlled   Updated Problems: No problems updated.  Plan of Care  Problem-specific:  Assessment and Plan    Chronic pain syndrome Primarily affects lumbar region. Managed with hydrocodone , acetaminophen , and tramadol  without side effects. - Continue current pain management regimen with hydrocodone , acetaminophen , and tramadol .  Lumbar spondylosis with multilevel disc degeneration and facet arthropathy Moderate to severe lumbar spondylosis with disc space narrowing at L5-S1. Hardware from previous fusion intact. Low back pain persists. - Consider lumbar facet block at L5-S1 if pain management becomes inadequate.  Status post thoracolumbar spinal fusion Fusion from T10 to L4 with intact hardware. - Continue monitoring the integrity and function of the spinal fusion hardware.   Medication Management: Patient's pain is controlled with hydrocodone  and tramadol , will continue on current medication regimen.  Prescribing drug monitoring (PDMP) reviewed, findings consistent with the use  of prescribed medication and no evidence of narcotic misuse or abuse.  Urine drug screening (UDS) up to date.  No side effects or adverse reaction reported to medication.  Schedule follow-up in 90 days for medication management.      Cynthia Dawson has a current medication list which includes the following long-term medication(s): atorvastatin, calcium carbonate-vit d-min, fenofibrate, gabapentin , levothyroxine, lisinopril, metformin, metoprolol tartrate, and topiramate .  Pharmacotherapy (Medications Ordered): Meds ordered this encounter  Medications   HYDROcodone -acetaminophen  (NORCO/VICODIN) 5-325 MG tablet    Sig: Take 1-2 tablets by mouth 3 (three) times daily as needed for severe pain (pain score 7-10). Must last 30 days    Dispense:  45 tablet    Refill:  0    Chronic Pain: STOP Act (Not applicable) Fill 1 day early if closed on refill date. Avoid benzodiazepines within 8 hours of opioids   HYDROcodone -acetaminophen  (NORCO/VICODIN) 5-325 MG tablet    Sig: Take 1-2 tablets by mouth 3 (three) times daily as needed for severe pain (pain score 7-10). Must last 30 days    Dispense:  45 tablet    Refill:  0    Chronic Pain: STOP Act (Not applicable) Fill 1 day early if closed on refill date. Avoid benzodiazepines within 8 hours of opioids   HYDROcodone -acetaminophen  (NORCO/VICODIN) 5-325 MG tablet    Sig: Take 1-2 tablets by mouth 3 (three) times daily as needed for severe pain (pain score 7-10). Must last 30 days    Dispense:  45 tablet    Refill:  0    Chronic Pain: STOP Act (Not applicable) Fill 1 day early if closed on refill date. Avoid benzodiazepines within 8 hours of opioids   traMADol  (ULTRAM ) 50 MG tablet    Sig: Take 1 tablet (50 mg total) by mouth every 12 (twelve) hours as needed for severe pain (pain score 7-10). Must last 30 days    Dispense:  60 tablet    Refill:  2    Chronic Pain: STOP Act (Not applicable) Fill 1 day early if closed on refill date. Avoid  benzodiazepines within 8 hours of opioids   Orders:  No orders of the defined types were placed in this encounter.       Return in about 3 months (around 06/21/2024) for (F2F), (MM), Emmy Blanch NP.    Recent Visits Date Type Provider Dept  01/18/24 Office Visit Prabhjot Piscitello K, NP [facility]  Showing recent visits within past 90 days and meeting all other requirements Today's Visits Date Type Provider Dept  03/23/24 Office Visit Saskia Simerson K, NP [facility]  Showing today's visits and meeting all other requirements Future Appointments Date Type Provider Dept  06/21/24 Appointment Analina Filla K, NP [facility]  Showing future appointments within next 90 days and meeting all other requirements  I discussed the assessment and treatment plan with the patient. The patient was provided an opportunity to ask questions and all were answered. The patient agreed with the plan and demonstrated an understanding of the instructions.  Patient advised to call back or seek an in-person evaluation if the symptoms or condition worsens.  I personally spent a total of 30 minutes in the care of the patient today including preparing to see the patient, getting/reviewing separately obtained history, performing a medically appropriate exam/evaluation, counseling and educating, placing orders, referring and communicating with other health care professionals, documenting clinical information in the EHR, independently interpreting results, communicating results, and coordinating care.   Note by: Daveah Varone K Kollyn Lingafelter, NP (TTS and AI technology used. I apologize for any typographical errors that were not detected and corrected.) Date: 03/23/2024; Time: 2:44 PM

## 2024-03-30 ENCOUNTER — Telehealth: Payer: Self-pay | Admitting: Student in an Organized Health Care Education/Training Program

## 2024-03-30 ENCOUNTER — Telehealth: Payer: Self-pay | Admitting: *Deleted

## 2024-03-30 ENCOUNTER — Other Ambulatory Visit: Payer: Self-pay | Admitting: *Deleted

## 2024-03-30 NOTE — Telephone Encounter (Signed)
 Returning patient phone call;  patient has been waiting for her pain Rx from [facility].  This was supposed to have been delivered on 03/23/24 and has not gotten anything. I told her I would contact them and see what information I can get. Patient is agreeable to this.

## 2024-03-30 NOTE — Telephone Encounter (Signed)
 I have spoken with [facility] and they are working on finding the patient medication and will let me know what is happening when they know.

## 2024-03-30 NOTE — Telephone Encounter (Signed)
 I have communicated with [facility] and they are going to send out a tracer for this patients hydro - apap.  Pharmacy is unsure what has happened.  They did ask me if I wanted to authorize another shipment or refill of medication.  I told them NO, we need to figure out where the medication is and then get the medication to the patient as she has been waiting for a week no for it to arrive.  She did take my information and is going to research and call me back.

## 2024-03-30 NOTE — Telephone Encounter (Signed)
 PT stated that she have not received her Hydrocodone .

## 2024-03-30 NOTE — Telephone Encounter (Signed)
 Returned patient phone call to let her know that Se[facility] does not feel comfortable resending the medication again, we still do not know where the original medication is and [facility] has created a ticket  in an effort to locate the medication.    Seema has offered to send in Tramaol, patient has tramadol  and I have also advised her that she could use acetaminophen  with that since she is not getting it with her hydrocodone .  She has her tramadol  and reports that she will make do until she is able to get her medication.

## 2024-04-06 ENCOUNTER — Encounter: Admitting: Nurse Practitioner

## 2024-04-10 ENCOUNTER — Other Ambulatory Visit: Payer: Self-pay | Admitting: Student in an Organized Health Care Education/Training Program

## 2024-04-10 DIAGNOSIS — Z981 Arthrodesis status: Secondary | ICD-10-CM

## 2024-04-10 DIAGNOSIS — M961 Postlaminectomy syndrome, not elsewhere classified: Secondary | ICD-10-CM

## 2024-04-10 DIAGNOSIS — G894 Chronic pain syndrome: Secondary | ICD-10-CM

## 2024-04-10 DIAGNOSIS — M47817 Spondylosis without myelopathy or radiculopathy, lumbosacral region: Secondary | ICD-10-CM

## 2024-04-18 ENCOUNTER — Telehealth: Payer: Self-pay | Admitting: Student in an Organized Health Care Education/Training Program

## 2024-04-18 ENCOUNTER — Other Ambulatory Visit: Payer: Self-pay

## 2024-04-18 DIAGNOSIS — G894 Chronic pain syndrome: Secondary | ICD-10-CM

## 2024-04-18 DIAGNOSIS — M47817 Spondylosis without myelopathy or radiculopathy, lumbosacral region: Secondary | ICD-10-CM

## 2024-04-18 DIAGNOSIS — Z981 Arthrodesis status: Secondary | ICD-10-CM

## 2024-04-18 DIAGNOSIS — M961 Postlaminectomy syndrome, not elsewhere classified: Secondary | ICD-10-CM

## 2024-04-18 MED ORDER — TOPIRAMATE 50 MG PO TABS: 50.0000 mg | ORAL_TABLET | Freq: Two times a day (BID) | ORAL | 5 refills | Status: AC

## 2024-04-18 NOTE — Telephone Encounter (Signed)
 Pt needs prescription for Topiramate . I tried calling Pt back for clarification no answer.

## 2024-04-19 NOTE — Telephone Encounter (Signed)
 Patient called back today. She states the refill came in so she doesn't need us  to do anything now.

## 2024-06-21 ENCOUNTER — Encounter: Admitting: Nurse Practitioner
